# Patient Record
Sex: Male | Born: 1945 | Race: White | Hispanic: No | Marital: Married | Smoking: Never smoker
Health system: Southern US, Community
[De-identification: ages and names within clinical notes are randomized; demographics above are authoritative.]

## PROBLEM LIST (undated history)

## (undated) DIAGNOSIS — E785 Hyperlipidemia, unspecified: Secondary | ICD-10-CM

## (undated) DIAGNOSIS — L989 Disorder of the skin and subcutaneous tissue, unspecified: Secondary | ICD-10-CM

## (undated) DIAGNOSIS — I10 Essential (primary) hypertension: Secondary | ICD-10-CM

## (undated) HISTORY — DX: Essential (primary) hypertension: I10

## (undated) HISTORY — DX: Disorder of the skin and subcutaneous tissue, unspecified: L98.9

## (undated) HISTORY — PX: HERNIA REPAIR: SHX51

---

## 2011-03-14 HISTORY — PX: PROSTATE SURGERY: SHX751

## 2011-03-16 ENCOUNTER — Emergency Department (HOSPITAL_BASED_OUTPATIENT_CLINIC_OR_DEPARTMENT_OTHER)
Admission: EM | Admit: 2011-03-16 | Discharge: 2011-03-16 | Disposition: A | Discharge status: Home / Self Care | Payer: 59 | Attending: Emergency Medicine | Admitting: Emergency Medicine

## 2011-03-16 DIAGNOSIS — R339 Retention of urine, unspecified: Secondary | ICD-10-CM | POA: Insufficient documentation

## 2011-03-16 LAB — URINALYSIS, ROUTINE W REFLEX MICROSCOPIC
Bilirubin Urine: NEGATIVE
Nitrite: NEGATIVE
Protein, ur: NEGATIVE mg/dL
Specific Gravity, Urine: 1.018 (ref 1.005–1.030)
Urobilinogen, UA: 0.2 mg/dL (ref 0.0–1.0)

## 2011-03-16 LAB — URINE MICROSCOPIC-ADD ON

## 2011-04-10 ENCOUNTER — Ambulatory Visit (HOSPITAL_BASED_OUTPATIENT_CLINIC_OR_DEPARTMENT_OTHER)
Admission: RE | Admit: 2011-04-10 | Discharge: 2011-04-11 | Disposition: A | Discharge status: Home / Self Care | Payer: 59 | Source: Ambulatory Visit | Attending: Urology | Admitting: Urology

## 2011-04-10 ENCOUNTER — Other Ambulatory Visit: Payer: Self-pay | Admitting: Urology

## 2011-04-10 DIAGNOSIS — N138 Other obstructive and reflux uropathy: Secondary | ICD-10-CM | POA: Insufficient documentation

## 2011-04-10 DIAGNOSIS — N401 Enlarged prostate with lower urinary tract symptoms: Secondary | ICD-10-CM | POA: Insufficient documentation

## 2011-04-10 DIAGNOSIS — Z01810 Encounter for preprocedural cardiovascular examination: Secondary | ICD-10-CM | POA: Insufficient documentation

## 2011-04-10 DIAGNOSIS — R9431 Abnormal electrocardiogram [ECG] [EKG]: Secondary | ICD-10-CM | POA: Insufficient documentation

## 2011-04-10 DIAGNOSIS — R339 Retention of urine, unspecified: Secondary | ICD-10-CM | POA: Insufficient documentation

## 2011-04-10 DIAGNOSIS — Z79899 Other long term (current) drug therapy: Secondary | ICD-10-CM | POA: Insufficient documentation

## 2011-04-10 DIAGNOSIS — Z01812 Encounter for preprocedural laboratory examination: Secondary | ICD-10-CM | POA: Insufficient documentation

## 2011-04-10 DIAGNOSIS — I10 Essential (primary) hypertension: Secondary | ICD-10-CM | POA: Insufficient documentation

## 2011-04-10 DIAGNOSIS — K219 Gastro-esophageal reflux disease without esophagitis: Secondary | ICD-10-CM | POA: Insufficient documentation

## 2011-04-10 LAB — POCT I-STAT, CHEM 8
BUN: 12 mg/dL (ref 6–23)
Creatinine, Ser: 0.7 mg/dL (ref 0.50–1.35)
Glucose, Bld: 93 mg/dL (ref 70–99)
Potassium: 3.8 mEq/L (ref 3.5–5.1)
Sodium: 141 mEq/L (ref 135–145)

## 2011-04-13 NOTE — Op Note (Signed)
  NAMEEDVARDO, HONSE                ACCOUNT NO.:  [id number]  MEDICAL RECORD NO.:  LOCATION:                                 FACILITY:  PHYSICIAN:  Mirabelle Cyphers J. Annabell Howells, M.D.    DATE OF BIRTH:  [birth year]  DATE OF PROCEDURE:  04/10/2011 DATE OF DISCHARGE:                              OPERATIVE REPORT   PROCEDURE:  Transurethral resection of prostate.  PREOPERATIVE DIAGNOSIS:  Benign prostatic hyperplasia with retention.  POSTOPERATIVE DIAGNOSIS:  Benign prostatic hyperplasia with retention.  SURGEON:  Excell Seltzer. Annabell Howells, MD  ANESTHESIA:  General.  SPECIMEN:  Prostate chips.  DRAINS:  A 22-French three-way Foley catheter.  BLOOD LOSS:  500 cc.  COMPLICATIONS:  None.  INDICATIONS:  Mr. Tirado is a 65 year old [place] male with BPH with bladder outlet obstruction and acute urinary retention who is to undergo TURP.  FINDINGS OF PROCEDURE:  He was taken to operating room.  He was given Cipro, a general anesthetic was induced, fitted PAS hose.  He was placed in lithotomy position.  His perineum and genitalia were prepped with Betadine solution and he was draped in usual sterile fashion.  The 47- French continuous flow resectoscope sheath was inserted.  This was fitted to [place] handle with 12-degree lens and 24-French gyrus loop. Saline was used as the irrigant.  Resection was initiated at the bladder neck, middle lobe was resected down to the bladder neck fibers.  The floor of the prostate was then resected out to alongside the verumontanum.  The right lobe of the prostate was resected from bladder neck to apex to capsular fibers. This was followed by the left lobe.  Hemostasis was used as we progressed.  The chips were removed initially and then additional resection at the apex and the bladder neck and anterior prostate were performed followed by some residual resection of floor once the bulk of the prostate which was quite large was removed.  Once all tissue then resected,  hemostasis was achieved and the bladder was evacuated free of chips.  Inspection of bladder revealed intact ureteral orifices, bladder moderate trabeculation, no tumors or stones were noted.  At this point, a 22-French Foley catheter was inserted, balloon was filled with 30 cc sterile fluid.  The catheter was irrigated with saline with clear return and placed to continuous irrigation and straight drainage.  He was taken down from lithotomy position.  His anesthetic reversed and he was moved to the recovery room in stable condition.  There were no complications.     Excell Seltzer. Annabell Howells, M.D.     JJW/MEDQ  D:  04/10/2011  T:  04/11/2011  Job:  [id number]  Electronically Signed by Bjorn Pippin M.D. on 04/13/2011 12:08:48 PM

## 2011-12-08 ENCOUNTER — Ambulatory Visit (INDEPENDENT_AMBULATORY_CARE_PROVIDER_SITE_OTHER): Payer: 59 | Admitting: Surgery

## 2011-12-08 ENCOUNTER — Telehealth (INDEPENDENT_AMBULATORY_CARE_PROVIDER_SITE_OTHER): Payer: Self-pay

## 2011-12-08 NOTE — Telephone Encounter (Signed)
Left message with pt's wife to call us so I can give his new r/s appt from 2-25 to 3-11 with DR Biagio Quint arrive at 3:30pm.

## 2011-12-22 ENCOUNTER — Ambulatory Visit (INDEPENDENT_AMBULATORY_CARE_PROVIDER_SITE_OTHER): Payer: 59 | Admitting: General Surgery

## 2012-01-02 ENCOUNTER — Encounter (INDEPENDENT_AMBULATORY_CARE_PROVIDER_SITE_OTHER): Payer: Self-pay | Admitting: General Surgery

## 2012-01-02 ENCOUNTER — Ambulatory Visit (INDEPENDENT_AMBULATORY_CARE_PROVIDER_SITE_OTHER): Payer: 59 | Admitting: General Surgery

## 2012-01-02 VITALS — BP 162/112 | HR 62 | Temp 98.0°F | Resp 20 | Ht 70.0 in | Wt 192.0 lb

## 2012-01-02 DIAGNOSIS — R229 Localized swelling, mass and lump, unspecified: Secondary | ICD-10-CM

## 2012-01-02 DIAGNOSIS — R220 Localized swelling, mass and lump, head: Secondary | ICD-10-CM

## 2012-01-02 NOTE — Progress Notes (Signed)
Patient ID: Johnny Heath, male   DOB: Mar 09, 1946, 66 y.o.   MRN: [id number]  No chief complaint on file.   HPI Johnny Heath is a 66 y.o. male. This patient is referred for evaluation of left posterior scalp mass which he states has been present for many years. He says this started out small and just about pea-sized but over time has increased to its current size. He denies any discomfort from the lesion and denies any redness, drainage, or fevers. Denies any exacerbating or relieving symptoms. HPI  No past medical history on file. PMH: HTN  No past surgical history on file. PSH: prostate surgery  No family history on file. Noncontributory  Social History History  Substance Use Topics  . Smoking status: Not on file  . Smokeless tobacco: Not on file  . Alcohol Use: Not on file  He denies any smoking or drinking and is originally from [place] but has been here for 30 yrs  Allergies not on file PCN heart  No current outpatient prescriptions on file.  Meds: Amlodipine and  Review of Systems Review of Systems All other review of systems negative or noncontributory except as stated in the HPI  There were no vitals taken for this visit.  Physical Exam Physical Exam Physical Exam  Vitals reviewed. Constitutional: He is oriented to person, place, and time. He appears well-developed and well-nourished. No distress.  HENT:  Head: Normocephalic and atraumatic.  Mouth/Throat: No oropharyngeal exudate.  Eyes: Conjunctivae and EOM are normal. Pupils are equal, round, and reactive to light. Right eye exhibits no discharge. Left eye exhibits no discharge. No scleral icterus.  Neck: Normal range of motion. No tracheal deviation present.  Cardiovascular: Normal rate, regular rhythm and normal heart sounds.   Pulmonary/Chest: Effort normal and breath sounds normal. No stridor. No respiratory distress. He has no wheezes. He has no rales. He exhibits no tenderness.  Abdominal: Soft. Bowel  sounds are normal. He exhibits no distension and no mass. There is no tenderness. There is no rebound and no guarding.  Musculoskeletal: Normal range of motion. He exhibits no edema and no tenderness.  Neurological: He is alert and oriented to person, place, and time.  Skin: Skin is warm and dry. No rash noted. He is not diaphoretic. No erythema. No pallor. Left posterior scalp mass which feels mobile and is about 5cm in diameter.  It is soft and no sign of infection. Psychiatric: He has a normal mood and affect. His behavior is normal. Judgment and thought content normal.    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 This has been present for 30-40 years and has not caused any problems for him to this point. I discussed with him the options for excision versus continued observation and the risks and benefits of each. I recommended CT scan or evaluation since this is on the scalp it is fairly decent size. If you would like excision and would be happy to office to him and I did discuss with him the risks of the procedure including infection, bleeding, pain, scarring, difficulty with wound closure, and nerve injury and recurrence and expressed understanding. We will proceed with CT scan and he will follow up after this we discussed surgical excision.    Plan    We will check a CT scan of the head and he will follow up after this to discuss surgical excision if he desires  Lodema Pilot DAVID 01/02/2012, 8:28 AM

## 2012-01-09 ENCOUNTER — Other Ambulatory Visit: Payer: 59

## 2012-01-29 ENCOUNTER — Telehealth (INDEPENDENT_AMBULATORY_CARE_PROVIDER_SITE_OTHER): Payer: Self-pay

## 2012-01-29 ENCOUNTER — Other Ambulatory Visit (INDEPENDENT_AMBULATORY_CARE_PROVIDER_SITE_OTHER): Payer: Self-pay

## 2012-01-29 DIAGNOSIS — R220 Localized swelling, mass and lump, head: Secondary | ICD-10-CM

## 2012-01-29 NOTE — Telephone Encounter (Signed)
Peer-to-peer with [facility] insurance for approval of CT head, approval rec'd auth# [id number] --  auth # [id number].  Orders given to D.M. For scheduling

## 2012-01-30 ENCOUNTER — Telehealth (INDEPENDENT_AMBULATORY_CARE_PROVIDER_SITE_OTHER): Payer: Self-pay | Admitting: General Surgery

## 2012-01-30 ENCOUNTER — Encounter (INDEPENDENT_AMBULATORY_CARE_PROVIDER_SITE_OTHER): Payer: 59 | Admitting: General Surgery

## 2012-02-02 ENCOUNTER — Other Ambulatory Visit: Payer: 59

## 2012-02-02 NOTE — Telephone Encounter (Signed)
Patient cxl'd CT of head per [facility].

## 2012-02-06 ENCOUNTER — Encounter (INDEPENDENT_AMBULATORY_CARE_PROVIDER_SITE_OTHER): Payer: 59 | Admitting: General Surgery

## 2014-11-13 ENCOUNTER — Emergency Department (HOSPITAL_BASED_OUTPATIENT_CLINIC_OR_DEPARTMENT_OTHER)
Admission: EM | Admit: 2014-11-13 | Discharge: 2014-11-13 | Disposition: A | Discharge status: Home / Self Care | Payer: Medicare Other | Attending: Emergency Medicine | Admitting: Emergency Medicine

## 2014-11-13 ENCOUNTER — Encounter (HOSPITAL_BASED_OUTPATIENT_CLINIC_OR_DEPARTMENT_OTHER): Payer: Self-pay | Admitting: *Deleted

## 2014-11-13 ENCOUNTER — Emergency Department (HOSPITAL_BASED_OUTPATIENT_CLINIC_OR_DEPARTMENT_OTHER): Payer: Medicare Other

## 2014-11-13 DIAGNOSIS — I10 Essential (primary) hypertension: Secondary | ICD-10-CM | POA: Diagnosis not present

## 2014-11-13 DIAGNOSIS — R42 Dizziness and giddiness: Secondary | ICD-10-CM | POA: Diagnosis present

## 2014-11-13 DIAGNOSIS — Z880 Allergy status to penicillin: Secondary | ICD-10-CM | POA: Insufficient documentation

## 2014-11-13 DIAGNOSIS — Z872 Personal history of diseases of the skin and subcutaneous tissue: Secondary | ICD-10-CM | POA: Diagnosis not present

## 2014-11-13 LAB — URINE MICROSCOPIC-ADD ON

## 2014-11-13 LAB — COMPREHENSIVE METABOLIC PANEL
ALK PHOS: 76 U/L (ref 39–117)
ALT: 26 U/L (ref 0–53)
AST: 23 U/L (ref 0–37)
Albumin: 4.1 g/dL (ref 3.5–5.2)
Anion gap: 5 (ref 5–15)
BILIRUBIN TOTAL: 1.3 mg/dL — AB (ref 0.3–1.2)
BUN: 18 mg/dL (ref 6–23)
CHLORIDE: 107 mmol/L (ref 96–112)
CO2: 24 mmol/L (ref 19–32)
Calcium: 9 mg/dL (ref 8.4–10.5)
Creatinine, Ser: 1.03 mg/dL (ref 0.50–1.35)
GFR, EST AFRICAN AMERICAN: 84 mL/min — AB (ref >90)
GFR, EST NON AFRICAN AMERICAN: 72 mL/min — AB (ref >90)
GLUCOSE: 141 mg/dL — AB (ref 70–99)
Potassium: 3.4 mmol/L — ABNORMAL LOW (ref 3.5–5.1)
SODIUM: 136 mmol/L (ref 135–145)
Total Protein: 7.3 g/dL (ref 6.0–8.3)

## 2014-11-13 LAB — URINALYSIS, ROUTINE W REFLEX MICROSCOPIC
Bilirubin Urine: NEGATIVE
GLUCOSE, UA: NEGATIVE mg/dL
Ketones, ur: NEGATIVE mg/dL
LEUKOCYTES UA: NEGATIVE
Nitrite: NEGATIVE
PROTEIN: NEGATIVE mg/dL
SPECIFIC GRAVITY, URINE: 1.004 — AB (ref 1.005–1.030)
Urobilinogen, UA: 0.2 mg/dL (ref 0.0–1.0)
pH: 6.5 (ref 5.0–8.0)

## 2014-11-13 LAB — CBC
HEMATOCRIT: 45.2 % (ref 39.0–52.0)
Hemoglobin: 15.1 g/dL (ref 13.0–17.0)
MCH: 26.3 pg (ref 26.0–34.0)
MCHC: 33.4 g/dL (ref 30.0–36.0)
MCV: 78.7 fL (ref 78.0–100.0)
PLATELETS: 266 10*3/uL (ref 150–400)
RBC: 5.74 MIL/uL (ref 4.22–5.81)
RDW: 14.4 % (ref 11.5–15.5)
WBC: 7.2 10*3/uL (ref 4.0–10.5)

## 2014-11-13 LAB — TROPONIN I: Troponin I: <0.03 ng/mL (ref <0.031)

## 2014-11-13 MED ORDER — SODIUM CHLORIDE 0.9 % IV BOLUS (SEPSIS)
1000.0000 mL | INTRAVENOUS | Status: AC
  Administered 2014-11-13: 1000 mL via INTRAVENOUS

## 2014-11-13 NOTE — ED Notes (Signed)
Pt placed on cardiac monitor 

## 2014-11-13 NOTE — ED Notes (Signed)
C/o dizziness since Saturday. States it has been a long time ago he had same issue. No other sx.

## 2014-11-13 NOTE — Discharge Instructions (Signed)

## 2014-11-13 NOTE — ED Provider Notes (Signed)
CSN: [id number]     Arrival date & time 11/13/14  [id number] History   First MD Initiated Contact with Patient 11/13/14 0901     Chief Complaint  Patient presents with  . Dizziness     (Consider location/radiation/quality/duration/timing/severity/associated sxs/prior Treatment) Patient is a 69 y.o. male presenting with dizziness. The history is provided by the patient. The history is limited by a language barrier. No language interpreter was used (family member).  Dizziness Quality:  Room spinning Severity:  Mild Onset quality:  Sudden Duration:  2 days Timing:  Intermittent Progression:  Unchanged Chronicity:  New Context comment:  Worse when lying supine Relieved by:  Being still (sitting up) Exacerbated by: lying down. Ineffective treatments:  None tried Associated symptoms: no chest pain, no diarrhea, no headaches, no nausea, no shortness of breath and no vomiting     Past Medical History  Diagnosis Date  . Hypertension   . Skin lesion of scalp    Past Surgical History  Procedure Laterality Date  . Prostate surgery  03/2011   No family history on file. History  Substance Use Topics  . Smoking status: Never Smoker   . Smokeless tobacco: Never Used  . Alcohol Use: No    Review of Systems  Constitutional: Negative for fever.  HENT: Negative for drooling and rhinorrhea.   Eyes: Negative for pain.  Respiratory: Negative for cough and shortness of breath.   Cardiovascular: Negative for chest pain and leg swelling.  Gastrointestinal: Negative for nausea, vomiting, abdominal pain and diarrhea.  Genitourinary: Negative for dysuria and hematuria.  Musculoskeletal: Negative for gait problem and neck pain.  Skin: Negative for color change.  Neurological: Positive for dizziness. Negative for numbness and headaches.  Hematological: Negative for adenopathy.  Psychiatric/Behavioral: Negative for behavioral problems.  All other systems reviewed and are negative.     Allergies   Penicillins  Home Medications   Prior to Admission medications   Medication Sig Start Date End Date Taking? Authorizing Provider  amLODipine (NORVASC) 5 MG tablet Take 5 mg by mouth as needed.    Historical Provider, MD   BP 152/95 mmHg  Pulse 104  Temp(Src) 98.9 F (37.2 C) (Oral)  Resp 18  Ht 5\' 10"  (1.778 m)  Wt 186 lb (84.369 kg)  BMI 26.69 kg/m2  SpO2 96% Physical Exam  Constitutional: He is oriented to person, place, and time. He appears well-developed and well-nourished.  HENT:  Head: Normocephalic and atraumatic.  Right Ear: External ear normal.  Left Ear: External ear normal.  Nose: Nose normal.  Mouth/Throat: Oropharynx is clear and moist. No oropharyngeal exudate.  Eyes: Conjunctivae and EOM are normal. Pupils are equal, round, and reactive to light.  Neck: Normal range of motion. Neck supple.  Cardiovascular: Normal rate, regular rhythm, normal heart sounds and intact distal pulses.  Exam reveals no gallop and no friction rub.   No murmur heard. Pulmonary/Chest: Effort normal and breath sounds normal. No respiratory distress. He has no wheezes.  Abdominal: Soft. Bowel sounds are normal. He exhibits no distension. There is no tenderness. There is no rebound and no guarding.  Musculoskeletal: Normal range of motion. He exhibits no edema or tenderness.  Neurological: He is alert and oriented to person, place, and time.  alert, oriented x3 speech: normal in context and clarity memory: intact grossly cranial nerves II-XII: intact motor strength: full proximally and distally no involuntary movements or tremors sensation: intact to light touch diffusely  cerebellar: finger-to-nose and heel-to-shin intact gait: normal forwards  and backwards, normal tandem gait   Skin: Skin is warm and dry.  Psychiatric: He has a normal mood and affect. His behavior is normal.  Nursing note and vitals reviewed.   ED Course  Procedures (including critical care time) Labs  Review Labs Reviewed  COMPREHENSIVE METABOLIC PANEL - Abnormal; Notable for the following:    Potassium 3.4 (*)    Glucose, Bld 141 (*)    Total Bilirubin 1.3 (*)    GFR calc non Af Amer 72 (*)    GFR calc Af Amer 84 (*)    All other components within normal limits  URINALYSIS, ROUTINE W REFLEX MICROSCOPIC - Abnormal; Notable for the following:    Specific Gravity, Urine 1.004 (*)    Hgb urine dipstick TRACE (*)    All other components within normal limits  TROPONIN I  CBC  URINE MICROSCOPIC-ADD ON    Imaging Review Ct Head Wo Contrast  11/13/2014   CLINICAL DATA:  Dizziness beginning 11/11/2014.  EXAM: CT HEAD WITHOUT CONTRAST  TECHNIQUE: Contiguous axial images were obtained from the base of the skull through the vertex without intravenous contrast.  COMPARISON:  None.  FINDINGS: Mild atrophy is noted. No acute intracranial abnormality including hemorrhage, infarct, mass lesion, mass effect, midline shift or abnormal extra-axial fluid collection is identified. No hydrocephalus or pneumocephalus. The calvarium is intact. Imaged paranasal sinuses and mastoid air cells are clear.  IMPRESSION: No acute abnormality.  Mild atrophy.   Electronically Signed   By: Drusilla Kanner M.D.   On: 11/13/2014 09:41     EKG Interpretation   Date/Time:  Monday November 13 2014 09:02:33 EST Ventricular Rate:  86 PR Interval:  172 QRS Duration: 102 QT Interval:  348 QTC Calculation: 416 R Axis:   -3 Text Interpretation:  Normal sinus rhythm Septal infarct , age  undetermined Non-specific ST-t changes Otherwise no significant change  Confirmed by Lanaiya Lantry  MD, Caya Soberanis ([id number]) on 11/13/2014 9:05:50 AM      MDM   Final diagnoses:  Dizziness    9:17 AM 69 y.o. male who presents with concerns of high blood pressure and dizziness which began 2 days ago. The patient states that he had sudden onset dizziness while sitting on the couch. He notes it only lasted a few seconds. He has had several  recurrent episodes and his dizziness seems to be worse when lying supine. He is afebrile and vital signs are unremarkable on my exam. He has a normal neurologic exam. His son notes that his blood pressures have been running in the systolic 170s to 161W. Doubt CVA given the abrupt onset, intermittent nature, and worsening of dizziness w/ position. We'll get screening labs and imaging. Will hydrate.  10:29 AM: The patient continues to appear well. I recommended the patient make a diary of his blood pressure so that he can follow-up with his primary care doctor within the next 1-2 weeks. I doubt that his symptoms are related to CVA and this was discussed with the patient and his family. I recommended they return for any worsening of symptoms. I have discussed the diagnosis/risks/treatment options with the patient and family and believe the pt to be eligible for discharge home to follow-up with his pcp in 1-2 weeks. We also discussed returning to the ED immediately if new or worsening sx occur. We discussed the sx which are most concerning (e.g., worsening dizziness, ataxia, fever) that necessitate immediate return. Medications administered to the patient during their visit and any new prescriptions  provided to the patient are listed below.  Medications given during this visit Medications  sodium chloride 0.9 % bolus 1,000 mL (1,000 mLs Intravenous New Bag/Given 11/13/14 0930)    New Prescriptions   No medications on file     Purvis Sheffield, MD 11/13/14 1030

## 2015-10-14 IMAGING — CT CT HEAD W/O CM
1 series · 16 of 30 positions shown, 20 images · non-contrast
Comparison: None.

CLINICAL DATA: Dizziness beginning 11/11/2014.

EXAM:
CT HEAD WITHOUT CONTRAST
TECHNIQUE: Contiguous axial images were obtained from the base of the skull
through the vertex without intravenous contrast.

[Series 2: head 4.8 h37s · axial · 0.45mm/px · z∈[-63,+70]mm · 16 of 32 slices shown, 20 images]
[im 2/32  brain]
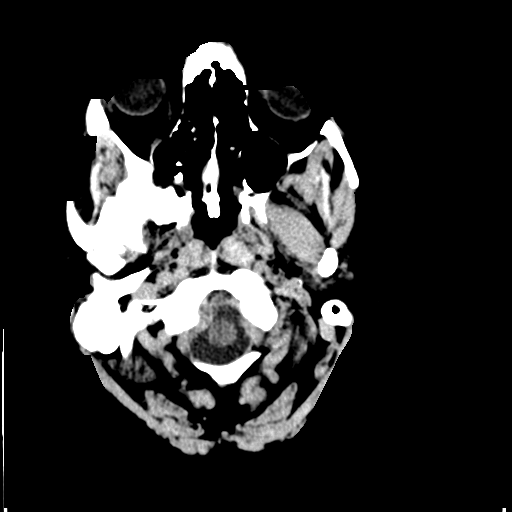
[im 2/32  bone]
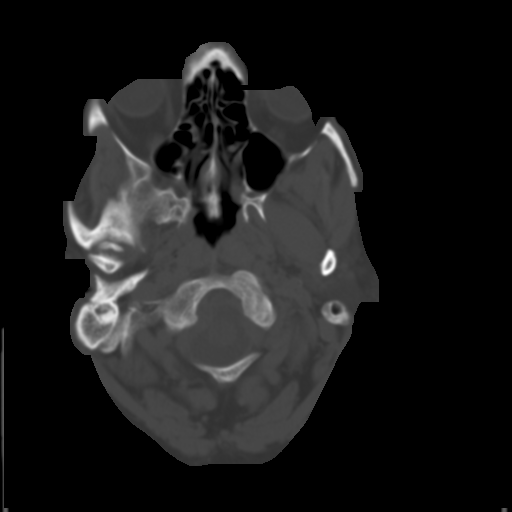
[im 4/32  brain]
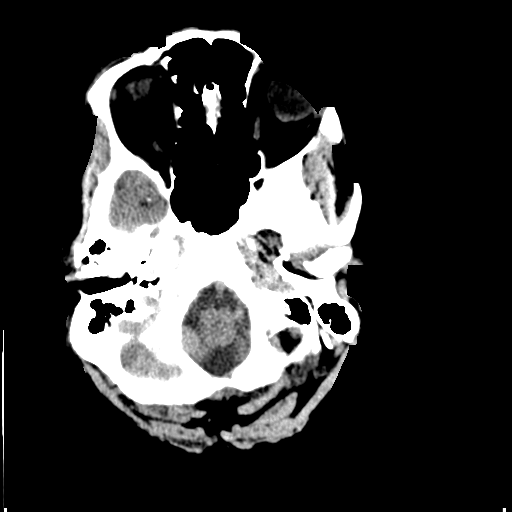
[im 6/32  brain]
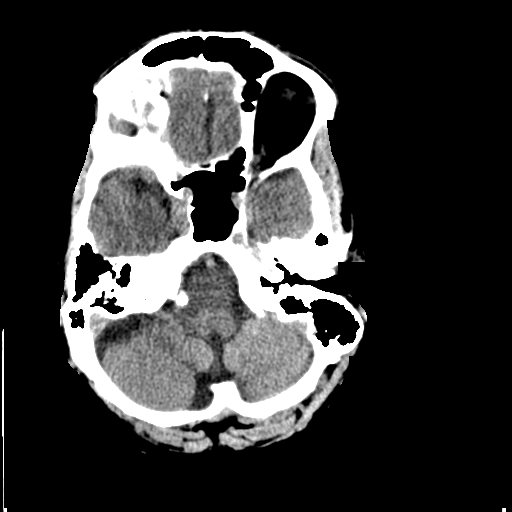
[im 8/32  brain]
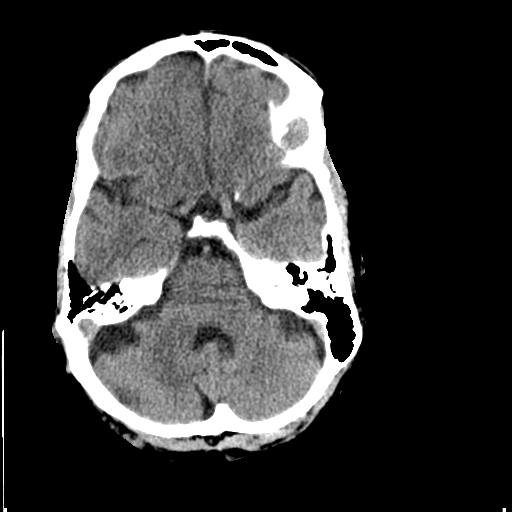
[im 9/32  brain]
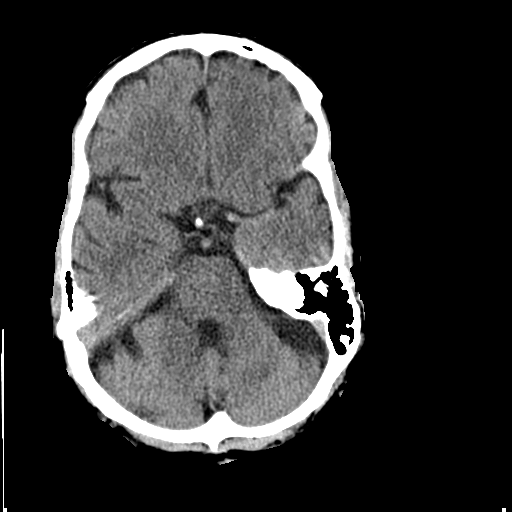
[im 9/32  bone]
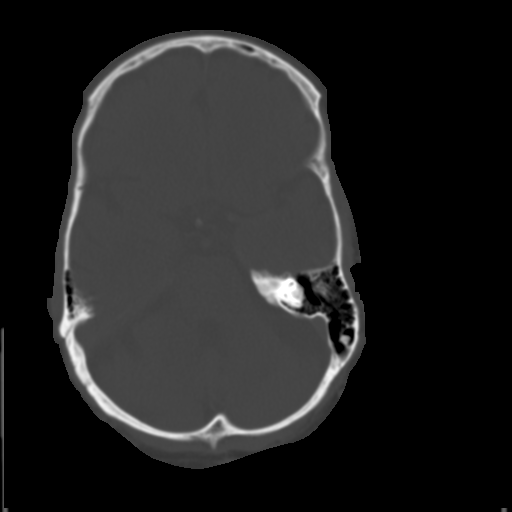
[im 11/32  brain]
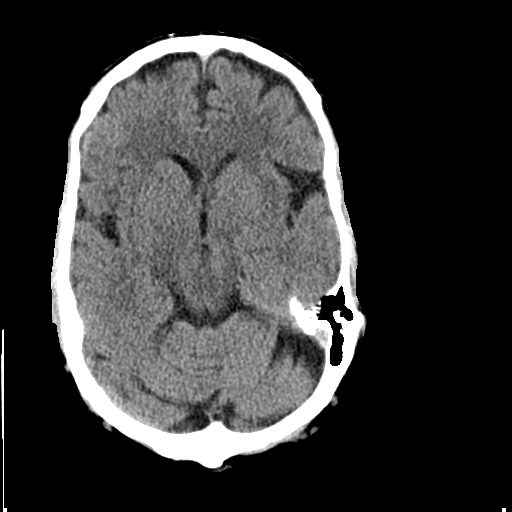
[im 13/32  brain]
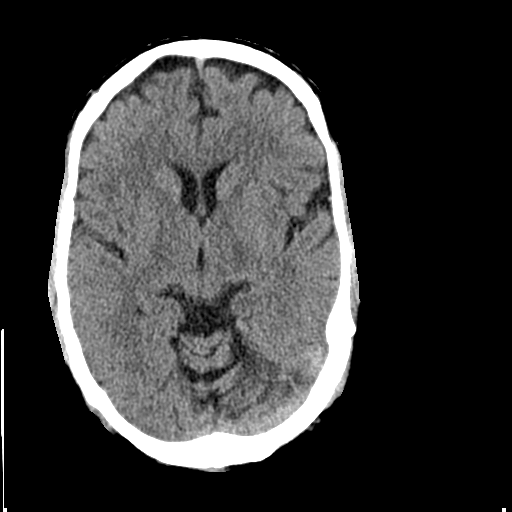
[im 15/32  brain]
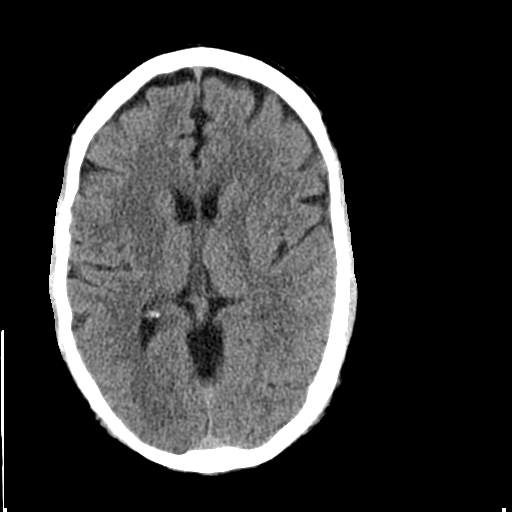
[im 17/32  brain]
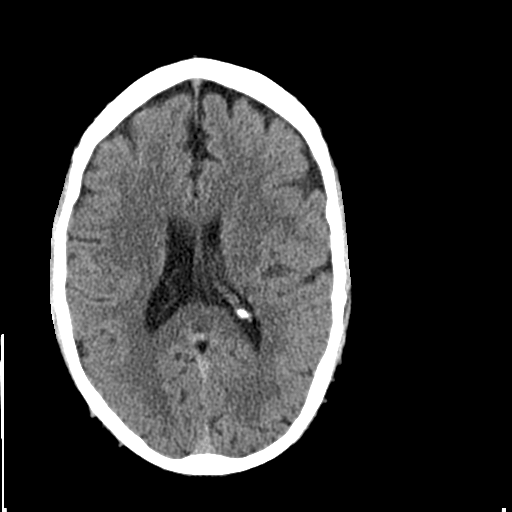
[im 17/32  bone]
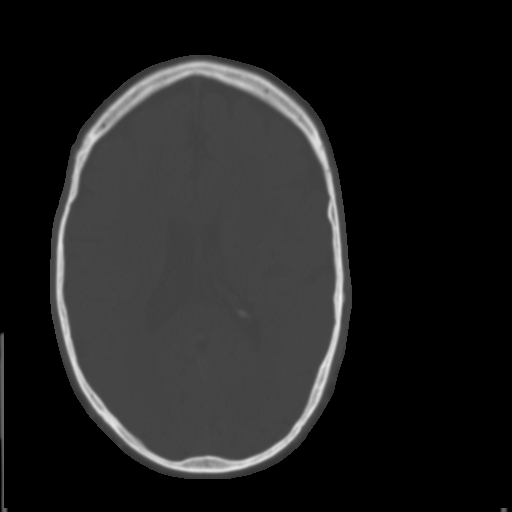
[im 19/32  brain]
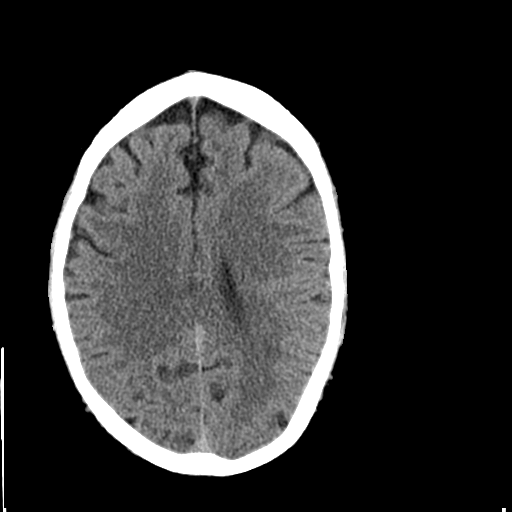
[im 21/32  brain]
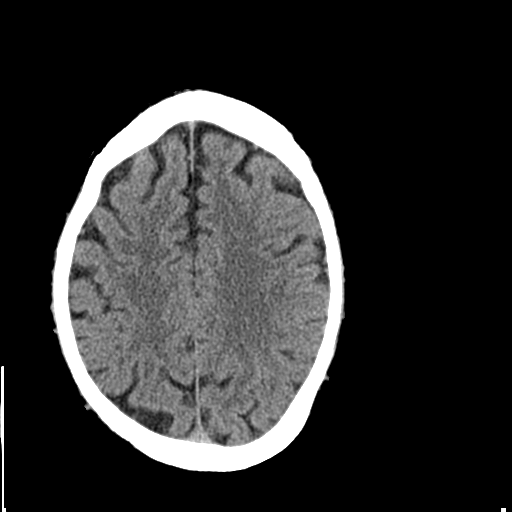
[im 23/32  brain]
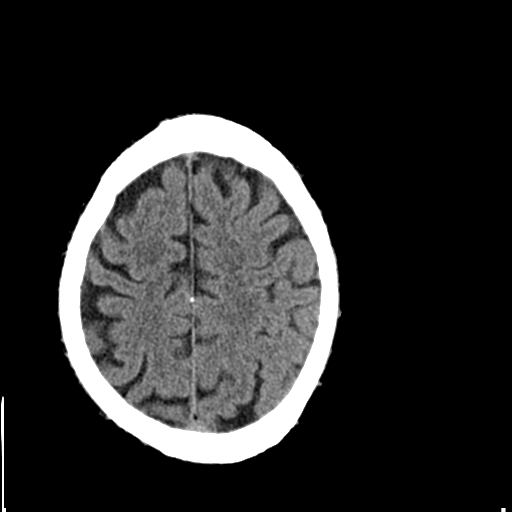
[im 24/32  brain]
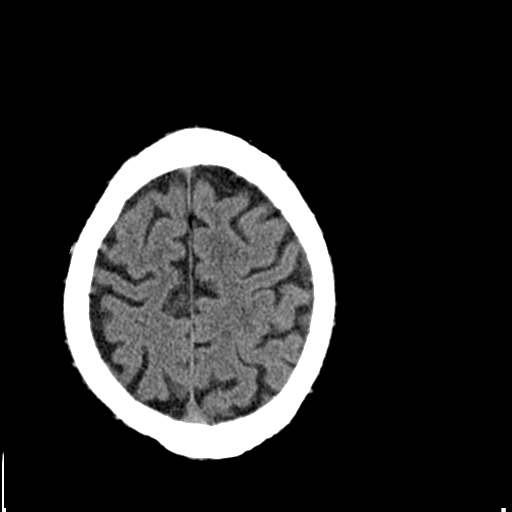
[im 24/32  bone]
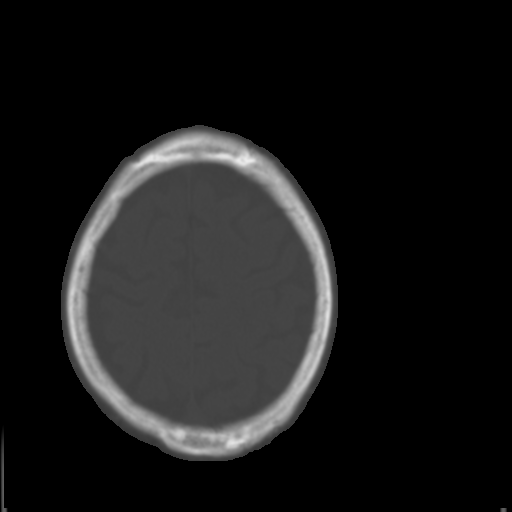
[im 26/32  brain]
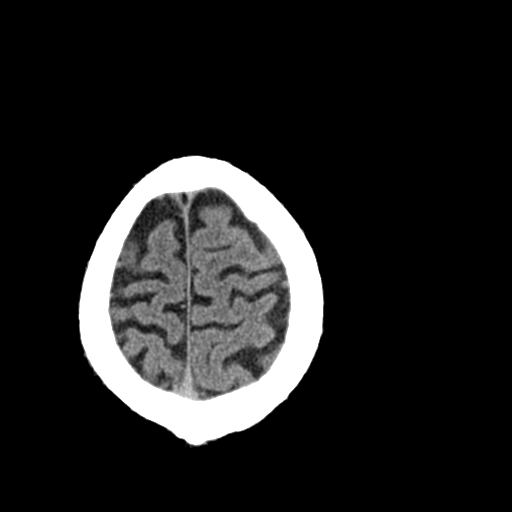
[im 28/32  brain]
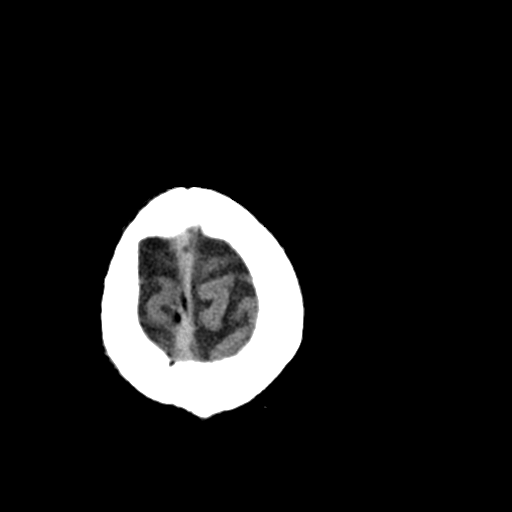
[im 30/32  brain]
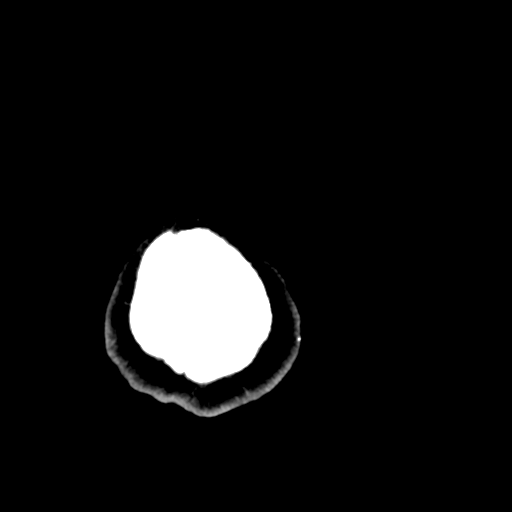

[16 of 30 positions shown; findings below may reference images not displayed]

FINDINGS: Mild atrophy is noted. No acute intracranial abnormality including
hemorrhage, infarct, mass lesion, mass effect, midline shift or
abnormal extra-axial fluid collection is identified. No
hydrocephalus or pneumocephalus. The calvarium is intact. Imaged
paranasal sinuses and mastoid air cells are clear.
IMPRESSION: No acute abnormality.  Mild atrophy.

## 2021-11-28 DIAGNOSIS — K4090 Unilateral inguinal hernia, without obstruction or gangrene, not specified as recurrent: Secondary | ICD-10-CM | POA: Insufficient documentation

## 2022-05-31 ENCOUNTER — Encounter (HOSPITAL_BASED_OUTPATIENT_CLINIC_OR_DEPARTMENT_OTHER): Payer: Self-pay | Admitting: Emergency Medicine

## 2022-05-31 ENCOUNTER — Other Ambulatory Visit: Payer: Self-pay

## 2022-05-31 ENCOUNTER — Emergency Department (HOSPITAL_BASED_OUTPATIENT_CLINIC_OR_DEPARTMENT_OTHER): Payer: Medicare Other

## 2022-05-31 ENCOUNTER — Emergency Department (HOSPITAL_BASED_OUTPATIENT_CLINIC_OR_DEPARTMENT_OTHER)
Admission: EM | Admit: 2022-05-31 | Discharge: 2022-05-31 | Disposition: A | Discharge status: Home / Self Care | Payer: Medicare Other | Attending: Emergency Medicine | Admitting: Emergency Medicine

## 2022-05-31 DIAGNOSIS — R1084 Generalized abdominal pain: Secondary | ICD-10-CM | POA: Insufficient documentation

## 2022-05-31 DIAGNOSIS — I10 Essential (primary) hypertension: Secondary | ICD-10-CM | POA: Diagnosis not present

## 2022-05-31 LAB — URINALYSIS, ROUTINE W REFLEX MICROSCOPIC
Bilirubin Urine: NEGATIVE
Glucose, UA: NEGATIVE mg/dL
Ketones, ur: NEGATIVE mg/dL
Leukocytes,Ua: NEGATIVE
Nitrite: NEGATIVE
Protein, ur: NEGATIVE mg/dL
Specific Gravity, Urine: <=1.005 (ref 1.005–1.030)
pH: 6 (ref 5.0–8.0)

## 2022-05-31 LAB — COMPREHENSIVE METABOLIC PANEL
ALT: 17 U/L (ref 0–44)
AST: 21 U/L (ref 15–41)
Albumin: 3.8 g/dL (ref 3.5–5.0)
Alkaline Phosphatase: 65 U/L (ref 38–126)
Anion gap: 7 (ref 5–15)
BUN: 16 mg/dL (ref 8–23)
CO2: 26 mmol/L (ref 22–32)
Calcium: 8.9 mg/dL (ref 8.9–10.3)
Chloride: 105 mmol/L (ref 98–111)
Creatinine, Ser: 0.92 mg/dL (ref 0.61–1.24)
GFR, Estimated: >60 mL/min (ref >60)
Glucose, Bld: 119 mg/dL — ABNORMAL HIGH (ref 70–99)
Potassium: 3.4 mmol/L — ABNORMAL LOW (ref 3.5–5.1)
Sodium: 138 mmol/L (ref 135–145)
Total Bilirubin: 1.9 mg/dL — ABNORMAL HIGH (ref 0.3–1.2)
Total Protein: 7.5 g/dL (ref 6.5–8.1)

## 2022-05-31 LAB — CBC
HCT: 40.5 % (ref 39.0–52.0)
Hemoglobin: 13.2 g/dL (ref 13.0–17.0)
MCH: 25.4 pg — ABNORMAL LOW (ref 26.0–34.0)
MCHC: 32.6 g/dL (ref 30.0–36.0)
MCV: 77.9 fL — ABNORMAL LOW (ref 80.0–100.0)
Platelets: 268 10*3/uL (ref 150–400)
RBC: 5.2 MIL/uL (ref 4.22–5.81)
RDW: 15.1 % (ref 11.5–15.5)
WBC: 7.7 10*3/uL (ref 4.0–10.5)
nRBC: 0 % (ref 0.0–0.2)

## 2022-05-31 LAB — URINALYSIS, MICROSCOPIC (REFLEX)

## 2022-05-31 LAB — LIPASE, BLOOD: Lipase: 37 U/L (ref 11–51)

## 2022-05-31 MED ORDER — IOHEXOL 300 MG/ML  SOLN
100.0000 mL | Freq: Once | INTRAMUSCULAR | Status: AC | PRN
  Administered 2022-05-31: 100 mL via INTRAVENOUS

## 2022-05-31 NOTE — ED Provider Notes (Signed)
[facility] EMERGENCY DEPARTMENT Provider Note   CSN: [id number] Arrival date & time: 05/31/22  1831     History  Chief Complaint  Patient presents with   Abdominal Pain    Johnny Heath is a 76 y.o. male.  Is here with abdominal pain for the last 4 days.  Denies any nausea, vomiting, diarrhea.  No fever.  History of hypertension.  Pain all over.  Nothing makes it worse or better.  Denies any fevers or chills.  No chest pain or shortness of breath.  The history is provided by the patient.       Home Medications Prior to Admission medications   Medication Sig Start Date End Date Taking? Authorizing Provider  amLODipine (NORVASC) 5 MG tablet Take 5 mg by mouth as needed.    [provider]      Allergies    Penicillins    Review of Systems   Review of Systems  Physical Exam Updated Vital Signs BP 136/86   Pulse 81   Temp 98.4 F (36.9 C) (Oral)   Resp (!) 25   Ht 5\' 10"  (1.778 m)   Wt 77.1 kg   SpO2 95%   BMI 24.39 kg/m  Physical Exam Vitals and nursing note reviewed.  Constitutional:      General: He is not in acute distress.    Appearance: He is well-developed. He is not ill-appearing.  HENT:     Head: Normocephalic and atraumatic.     Mouth/Throat:     Mouth: Mucous membranes are moist.  Eyes:     Conjunctiva/sclera: Conjunctivae normal.     Pupils: Pupils are equal, round, and reactive to light.  Cardiovascular:     Rate and Rhythm: Normal rate and regular rhythm.     Heart sounds: Normal heart sounds. No murmur heard. Pulmonary:     Effort: Pulmonary effort is normal. No respiratory distress.     Breath sounds: Normal breath sounds.  Abdominal:     Palpations: Abdomen is soft.     Tenderness: There is generalized abdominal tenderness.  Musculoskeletal:        General: No swelling.     Cervical back: Neck supple.  Skin:    General: Skin is warm and dry.     Capillary Refill: Capillary refill takes less than 2 seconds.   Neurological:     Mental Status: He is alert.  Psychiatric:        Mood and Affect: Mood normal.     ED Results / Procedures / Treatments   Labs (all labs ordered are listed, but only abnormal results are displayed) Labs Reviewed  COMPREHENSIVE METABOLIC PANEL - Abnormal; Notable for the following components:      Result Value   Potassium 3.4 (*)    Glucose, Bld 119 (*)    Total Bilirubin 1.9 (*)    All other components within normal limits  CBC - Abnormal; Notable for the following components:   MCV 77.9 (*)    MCH 25.4 (*)    All other components within normal limits  URINALYSIS, ROUTINE W REFLEX MICROSCOPIC - Abnormal; Notable for the following components:   Hgb urine dipstick SMALL (*)    All other components within normal limits  URINALYSIS, MICROSCOPIC (REFLEX) - Abnormal; Notable for the following components:   Bacteria, UA RARE (*)    All other components within normal limits  LIPASE, BLOOD    EKG EKG Interpretation  Date/Time:  Saturday May 31 2022 19:18:39  EDT Ventricular Rate:  79 PR Interval:    QRS Duration: 99 QT Interval:  388 QTC Calculation: 445 R Axis:   6 Text Interpretation: Normal sinus rhythm Borderline T abnormalities, inferior leads Confirmed by Virgina Norfolk ([id number]) on 05/31/2022 7:20:51 PM  Radiology CT ABDOMEN PELVIS W CONTRAST  Result Date: 05/31/2022 CLINICAL DATA:  Abdominal pain, acute, nonlocalized. EXAM: CT ABDOMEN AND PELVIS WITH CONTRAST TECHNIQUE: Multidetector CT imaging of the abdomen and pelvis was performed using the standard protocol following bolus administration of intravenous contrast. RADIATION DOSE REDUCTION: This exam was performed according to the departmental dose-optimization program which includes automated exposure control, adjustment of the mA and/or kV according to patient size and/or use of iterative reconstruction technique. CONTRAST:  OMNIPAQUE IOHEXOL 300 MG/ML  SOLN COMPARISON:  None Available. FINDINGS:  Lower chest: The heart is enlarged and there is no pericardial effusion. Atelectasis is noted at the lung bases bilaterally. Hepatobiliary: No focal liver abnormality. Mild hepatic steatosis is noted. No biliary ductal dilatation. Stones are present within the gallbladder. Pancreas: Unremarkable. No pancreatic ductal dilatation or surrounding inflammatory changes. Spleen: Normal in size without focal abnormality. Adrenals/Urinary Tract: No adrenal nodule or mass. The kidneys enhance symmetrically. Cysts are noted in the kidneys bilaterally. There is a 9 mm exophytic hypodensity in the upper pole of the left kidney with indeterminate imaging characteristics. No renal calculus or hydronephrosis. The bladder is unremarkable. Stomach/Bowel: There is a small hiatal hernia. The stomach is otherwise within normal limits. Multiple loops of mildly distended small bowel with air-fluid levels are noted in the abdomen measuring up to 3.3 cm. No free air or pneumatosis is seen. There is a changing caliber in the small bowel in the right lower quadrant with bowel wall thickening and intraluminal narrowing, best seen on axial image 74. Associated findings are mesenteric hyperemia and a small amount of free fluid in the mesenteric folds in the regions. Scattered diverticula are present along the colon without evidence of diverticulitis. A normal appendix is seen in the right lower quadrant. Vascular/Lymphatic: Mild atherosclerotic calcification of the aorta. Prominent lymph nodes are noted in the mesentery in the right lower quadrant measuring up to 9 mm. Reproductive: Prostate gland is enlarged. Other: Small amount of free fluid in the pelvis and mesenteric folds. A fat containing right inguinal hernia is noted. Musculoskeletal: Degenerative changes are present in the thoracolumbar spine. No acute or suspicious osseous abnormality. IMPRESSION: 1. Mildly distended loops of small bowel in the abdomen with air-fluid levels, hyperemia  and a small amount of free fluid in the mesenteric folds. There is a change in caliber of small bowel in the right lower quadrant with a segment distal small bowel with bowel wall thickening and intraluminal narrowing. Differential diagnosis includes infectious or inflammatory enteritis versus partial or early small bowel obstruction. Surgical consultation is suggested. 2. Diverticulosis without diverticulitis. 3. Small hiatal hernia. 4. Cholelithiasis. 5. Bilateral renal cysts. There is a complex exophytic lesion in the upper pole of the left kidney. Ultrasound is recommended for further characterization on nonemergent follow-up. 6. Enlarged prostate gland. 7. Minimal aortic atherosclerosis. Electronically Signed   By: Thornell Sartorius M.D.   On: 05/31/2022 20:40    Procedures Procedures    Medications Ordered in ED Medications  iohexol (OMNIPAQUE) 300 MG/ML solution 100 mL (100 mLs Intravenous Contrast Given 05/31/22 2010)    ED Course/ Medical Decision Making/ A&P  Medical Decision Making Amount and/or Complexity of Data Reviewed Labs: ordered. Radiology: ordered.  Risk Prescription drug management.   Johnny Heath is here with abdominal pain.  Normal vitals.  No fever.  Differential diagnosis is bowel obstruction versus diverticulitis versus pancreatitis versus colitis versus constipation/bowel gas.  Will check CBC, CMP, lipase, CT scan abdomen pelvis.  Patient overall appears well.  Per my review and interpretation of labs is no significant anemia, electrolyte abnormality, kidney injury, leukocytosis.  Lipase is normal.  Urinalysis with no evidence of infection.  Overall blood work is unremarkable.  CT scan showed mildly distended loops of small bowel in the abdomen with air-fluid levels, hyperemia, and a small amount of free fluid in the mesenteric folds.  There is change in the caliber of the small bowel in the right lower quadrant with may be some bowel wall  thickening and intraluminal narrowing.  Possible enteritis versus partial early small bowel obstruction.  Talked with Dr. Carolynne Edouard with general surgery who recommended clinical judgment.  I talked with the patient and overall sounds like his symptoms have really improved today.  He has been able to tolerate food.  He is not having any nausea or vomiting.  He states that his bowel movements have been normal.  He is passing gas.  Overall clinically I think that he likely has some sort of enteritis.  He has never had abdominal surgery.  No obvious mass on his CT.  Overall shared decision was made with the patient.    Clinically he feels better.  I have instructed him if he has nausea, vomiting, worsening pain, not passing gas or having bowel movements that he likely needs to come back for reevaluation.  At this time suspect that this is an enteritis that will be self resolving.  Discharged in good condition.  This chart was dictated using voice recognition software.  Despite best efforts to proofread,  errors can occur which can change the documentation meaning.         Final Clinical Impression(s) / ED Diagnoses Final diagnoses:  Generalized abdominal pain    Rx / DC Orders ED Discharge Orders     None         Virgina Norfolk, DO 05/31/22 2137

## 2022-05-31 NOTE — Discharge Instructions (Signed)
If you develop worsening abdominal pain, nausea, vomiting, not passing gas or having normal bowel movements please return for evaluation as discussed.  Otherwise follow-up with gastroenterologist.  Recommend soft diet for the next 24 hours and advance from there.

## 2022-05-31 NOTE — ED Triage Notes (Signed)
Pt arrives pov, c/o abdominal x 4 days. Denies N/v/d, denies fever

## 2023-07-29 DIAGNOSIS — K56609 Unspecified intestinal obstruction, unspecified as to partial versus complete obstruction: Secondary | ICD-10-CM | POA: Diagnosis present

## 2024-02-29 ENCOUNTER — Emergency Department (HOSPITAL_BASED_OUTPATIENT_CLINIC_OR_DEPARTMENT_OTHER)

## 2024-02-29 ENCOUNTER — Inpatient Hospital Stay (HOSPITAL_BASED_OUTPATIENT_CLINIC_OR_DEPARTMENT_OTHER)
Admission: EM | Admit: 2024-02-29 | Discharge: 2024-03-02 | DRG: 390 | Disposition: A | Discharge status: Home / Self Care | Attending: Family Medicine | Admitting: Family Medicine

## 2024-02-29 ENCOUNTER — Encounter (HOSPITAL_BASED_OUTPATIENT_CLINIC_OR_DEPARTMENT_OTHER): Payer: Self-pay

## 2024-02-29 ENCOUNTER — Inpatient Hospital Stay (HOSPITAL_COMMUNITY)

## 2024-02-29 ENCOUNTER — Other Ambulatory Visit: Payer: Self-pay

## 2024-02-29 DIAGNOSIS — K56609 Unspecified intestinal obstruction, unspecified as to partial versus complete obstruction: Secondary | ICD-10-CM | POA: Diagnosis present

## 2024-02-29 DIAGNOSIS — N2889 Other specified disorders of kidney and ureter: Secondary | ICD-10-CM | POA: Diagnosis present

## 2024-02-29 DIAGNOSIS — N400 Enlarged prostate without lower urinary tract symptoms: Secondary | ICD-10-CM | POA: Diagnosis present

## 2024-02-29 DIAGNOSIS — Z79899 Other long term (current) drug therapy: Secondary | ICD-10-CM

## 2024-02-29 DIAGNOSIS — R972 Elevated prostate specific antigen [PSA]: Secondary | ICD-10-CM | POA: Diagnosis present

## 2024-02-29 DIAGNOSIS — M899 Disorder of bone, unspecified: Secondary | ICD-10-CM | POA: Diagnosis present

## 2024-02-29 DIAGNOSIS — N281 Cyst of kidney, acquired: Secondary | ICD-10-CM

## 2024-02-29 DIAGNOSIS — I10 Essential (primary) hypertension: Secondary | ICD-10-CM | POA: Diagnosis present

## 2024-02-29 DIAGNOSIS — K5730 Diverticulosis of large intestine without perforation or abscess without bleeding: Secondary | ICD-10-CM | POA: Diagnosis present

## 2024-02-29 DIAGNOSIS — E785 Hyperlipidemia, unspecified: Secondary | ICD-10-CM | POA: Diagnosis present

## 2024-02-29 HISTORY — DX: Diverticulosis of large intestine without perforation or abscess without bleeding: K57.30

## 2024-02-29 HISTORY — DX: Hyperlipidemia, unspecified: E78.5

## 2024-02-29 LAB — URINALYSIS, ROUTINE W REFLEX MICROSCOPIC
Bilirubin Urine: NEGATIVE
Glucose, UA: NEGATIVE mg/dL
Ketones, ur: NEGATIVE mg/dL
Leukocytes,Ua: NEGATIVE
Nitrite: NEGATIVE
Protein, ur: NEGATIVE mg/dL
Specific Gravity, Urine: 1.02 (ref 1.005–1.030)
pH: 6.5 (ref 5.0–8.0)

## 2024-02-29 LAB — URINALYSIS, MICROSCOPIC (REFLEX)
Bacteria, UA: NONE SEEN
RBC / HPF: NONE SEEN RBC/hpf (ref 0–5)
Squamous Epithelial / HPF: NONE SEEN /HPF (ref 0–5)
WBC, UA: NONE SEEN WBC/hpf (ref 0–5)

## 2024-02-29 LAB — COMPREHENSIVE METABOLIC PANEL WITH GFR
ALT: 14 U/L (ref 0–44)
AST: 19 U/L (ref 15–41)
Albumin: 4.2 g/dL (ref 3.5–5.0)
Alkaline Phosphatase: 79 U/L (ref 38–126)
Anion gap: 11 (ref 5–15)
BUN: 15 mg/dL (ref 8–23)
CO2: 25 mmol/L (ref 22–32)
Calcium: 9.1 mg/dL (ref 8.9–10.3)
Chloride: 101 mmol/L (ref 98–111)
Creatinine, Ser: 0.98 mg/dL (ref 0.61–1.24)
GFR, Estimated: >60 mL/min (ref >60)
Glucose, Bld: 118 mg/dL — ABNORMAL HIGH (ref 70–99)
Potassium: 3.9 mmol/L (ref 3.5–5.1)
Sodium: 137 mmol/L (ref 135–145)
Total Bilirubin: 1.8 mg/dL — ABNORMAL HIGH (ref 0.0–1.2)
Total Protein: 7.1 g/dL (ref 6.5–8.1)

## 2024-02-29 LAB — CBC WITH DIFFERENTIAL/PLATELET
Abs Immature Granulocytes: 0.01 10*3/uL (ref 0.00–0.07)
Basophils Absolute: 0 10*3/uL (ref 0.0–0.1)
Basophils Relative: 0 %
Eosinophils Absolute: 0 10*3/uL (ref 0.0–0.5)
Eosinophils Relative: 0 %
HCT: 39.7 % (ref 39.0–52.0)
Hemoglobin: 12.8 g/dL — ABNORMAL LOW (ref 13.0–17.0)
Immature Granulocytes: 0 %
Lymphocytes Relative: 10 %
Lymphs Abs: 0.9 10*3/uL (ref 0.7–4.0)
MCH: 25 pg — ABNORMAL LOW (ref 26.0–34.0)
MCHC: 32.2 g/dL (ref 30.0–36.0)
MCV: 77.4 fL — ABNORMAL LOW (ref 80.0–100.0)
Monocytes Absolute: 0.5 10*3/uL (ref 0.1–1.0)
Monocytes Relative: 6 %
Neutro Abs: 7.6 10*3/uL (ref 1.7–7.7)
Neutrophils Relative %: 84 %
Platelets: 233 10*3/uL (ref 150–400)
RBC: 5.13 MIL/uL (ref 4.22–5.81)
RDW: 15 % (ref 11.5–15.5)
WBC: 9 10*3/uL (ref 4.0–10.5)
nRBC: 0 % (ref 0.0–0.2)

## 2024-02-29 LAB — LIPASE, BLOOD: Lipase: 48 U/L (ref 11–51)

## 2024-02-29 LAB — LACTIC ACID, PLASMA: Lactic Acid, Venous: 1 mmol/L (ref 0.5–1.9)

## 2024-02-29 LAB — PSA: Prostatic Specific Antigen: 5.67 ng/mL — ABNORMAL HIGH (ref 0.00–4.00)

## 2024-02-29 MED ORDER — MORPHINE SULFATE (PF) 4 MG/ML IV SOLN
4.0000 mg | Freq: Once | INTRAVENOUS | Status: AC
  Administered 2024-02-29: 4 mg via INTRAVENOUS
  Filled 2024-02-29: qty 1

## 2024-02-29 MED ORDER — LACTATED RINGERS IV BOLUS
1000.0000 mL | Freq: Once | INTRAVENOUS | Status: AC
  Administered 2024-02-29: 1000 mL via INTRAVENOUS

## 2024-02-29 MED ORDER — CARMEX CLASSIC LIP BALM EX OINT
TOPICAL_OINTMENT | CUTANEOUS | Status: DC | PRN
  Filled 2024-02-29: qty 10

## 2024-02-29 MED ORDER — MORPHINE SULFATE (PF) 2 MG/ML IV SOLN: 2.0000 mg | INTRAVENOUS | Status: DC | PRN

## 2024-02-29 MED ORDER — HYDRALAZINE HCL 20 MG/ML IJ SOLN: 10.0000 mg | INTRAMUSCULAR | Status: DC | PRN

## 2024-02-29 MED ORDER — ONDANSETRON HCL 4 MG PO TABS: 4.0000 mg | ORAL_TABLET | Freq: Four times a day (QID) | ORAL | Status: DC | PRN

## 2024-02-29 MED ORDER — LACTATED RINGERS IV BOLUS: 1000.0000 mL | Freq: Three times a day (TID) | INTRAVENOUS | Status: AC | PRN

## 2024-02-29 MED ORDER — POTASSIUM CHLORIDE IN NACL 20-0.9 MEQ/L-% IV SOLN
INTRAVENOUS | Status: AC
  Filled 2024-02-29 (×2): qty 1000

## 2024-02-29 MED ORDER — DIATRIZOATE MEGLUMINE & SODIUM 66-10 % PO SOLN
90.0000 mL | Freq: Once | ORAL | Status: AC
  Administered 2024-02-29: 90 mL via NASOGASTRIC
  Filled 2024-02-29: qty 90

## 2024-02-29 MED ORDER — ACETAMINOPHEN 325 MG PO TABS
650.0000 mg | ORAL_TABLET | Freq: Four times a day (QID) | ORAL | Status: DC | PRN
  Filled 2024-02-29: qty 2

## 2024-02-29 MED ORDER — ACETAMINOPHEN 650 MG RE SUPP: 650.0000 mg | Freq: Four times a day (QID) | RECTAL | Status: DC | PRN

## 2024-02-29 MED ORDER — SODIUM CHLORIDE 0.9 % IV BOLUS
1000.0000 mL | Freq: Once | INTRAVENOUS | Status: AC
  Administered 2024-02-29: 1000 mL via INTRAVENOUS

## 2024-02-29 MED ORDER — BISACODYL 10 MG RE SUPP
10.0000 mg | Freq: Every day | RECTAL | Status: DC
  Administered 2024-02-29: 10 mg via RECTAL
  Filled 2024-02-29: qty 1

## 2024-02-29 MED ORDER — ONDANSETRON HCL 4 MG/2ML IJ SOLN
4.0000 mg | Freq: Once | INTRAMUSCULAR | Status: AC
  Administered 2024-02-29: 4 mg via INTRAVENOUS
  Filled 2024-02-29: qty 2

## 2024-02-29 MED ORDER — MENTHOL 3 MG MT LOZG: 1.0000 | LOZENGE | OROMUCOSAL | Status: DC | PRN

## 2024-02-29 MED ORDER — HYDROMORPHONE HCL 1 MG/ML IJ SOLN
0.5000 mg | INTRAMUSCULAR | Status: DC | PRN
  Administered 2024-02-29 (×2): 1 mg via INTRAVENOUS
  Filled 2024-02-29 (×2): qty 1

## 2024-02-29 MED ORDER — ONDANSETRON HCL 4 MG/2ML IJ SOLN
4.0000 mg | Freq: Four times a day (QID) | INTRAMUSCULAR | Status: DC | PRN
  Administered 2024-02-29 (×2): 4 mg via INTRAVENOUS
  Filled 2024-02-29 (×2): qty 2

## 2024-02-29 MED ORDER — IOHEXOL 300 MG/ML  SOLN
100.0000 mL | Freq: Once | INTRAMUSCULAR | Status: AC | PRN
  Administered 2024-02-29: 100 mL via INTRAVENOUS

## 2024-02-29 MED ORDER — ONDANSETRON HCL 4 MG/2ML IJ SOLN: 4.0000 mg | Freq: Four times a day (QID) | INTRAMUSCULAR | Status: DC | PRN

## 2024-02-29 MED ORDER — PANTOPRAZOLE SODIUM 40 MG IV SOLR
40.0000 mg | Freq: Every day | INTRAVENOUS | Status: DC
  Administered 2024-02-29 – 2024-03-02 (×3): 40 mg via INTRAVENOUS
  Filled 2024-02-29 (×3): qty 10

## 2024-02-29 MED ORDER — METHOCARBAMOL 1000 MG/10ML IJ SOLN: 1000.0000 mg | Freq: Four times a day (QID) | INTRAMUSCULAR | Status: DC | PRN

## 2024-02-29 MED ORDER — MAGNESIUM SULFATE 2 GM/50ML IV SOLN
2.0000 g | Freq: Once | INTRAVENOUS | Status: AC
  Administered 2024-02-29: 2 g via INTRAVENOUS
  Filled 2024-02-29: qty 50

## 2024-02-29 MED ORDER — PROCHLORPERAZINE EDISYLATE 10 MG/2ML IJ SOLN: 5.0000 mg | INTRAMUSCULAR | Status: DC | PRN

## 2024-02-29 MED ORDER — NAPHAZOLINE-GLYCERIN 0.012-0.25 % OP SOLN: 1.0000 [drp] | Freq: Four times a day (QID) | OPHTHALMIC | Status: DC | PRN

## 2024-02-29 MED ORDER — PHENOL 1.4 % MT LIQD
2.0000 | OROMUCOSAL | Status: DC | PRN
  Administered 2024-03-01: 2 via OROMUCOSAL
  Filled 2024-02-29: qty 177

## 2024-02-29 MED ORDER — MAGIC MOUTHWASH: 15.0000 mL | Freq: Four times a day (QID) | ORAL | Status: DC | PRN

## 2024-02-29 MED ORDER — SALINE SPRAY 0.65 % NA SOLN: 1.0000 | Freq: Four times a day (QID) | NASAL | Status: DC | PRN

## 2024-02-29 NOTE — ED Provider Notes (Signed)
 [place] EMERGENCY DEPARTMENT AT [facility] Provider Note   CSN: [id number] Arrival date & time: 02/29/24  0703     History  Chief Complaint  Patient presents with   Abdominal Pain    Johnny Heath is a 78 y.o. male.  Pt is a 78 yo male with pmhx significant for htn, hld, and hx sbo from right inguinal hernia (repaired 10/24).  Pt developed diffuse abd pain on Saturday, 5/17.  He did vomit last night and this am.  No bm since 5/17.  No fevers.  No other sx.       Home Medications Prior to Admission medications   Medication Sig Start Date End Date Taking? Authorizing Provider  amLODipine (NORVASC) 5 MG tablet Take 5 mg by mouth as needed.    [provider]  atorvastatin (LIPITOR) 20 MG tablet Take 20 mg by mouth daily.    [provider]      Allergies    Penicillins    Review of Systems   Review of Systems  Gastrointestinal:  Positive for abdominal pain, nausea and vomiting.  All other systems reviewed and are negative.   Physical Exam Updated Vital Signs BP (!) 140/89   Pulse 79   Temp 98.8 F (37.1 C) (Oral)   Resp 14   Ht 5\' 10"  (1.778 m)   Wt 75.8 kg   SpO2 97%   BMI 23.96 kg/m  Physical Exam Vitals and nursing note reviewed.  Constitutional:      Appearance: He is well-developed.  HENT:     Head: Normocephalic and atraumatic.     Mouth/Throat:     Mouth: Mucous membranes are dry.  Eyes:     Extraocular Movements: Extraocular movements intact.     Pupils: Pupils are equal, round, and reactive to light.  Cardiovascular:     Rate and Rhythm: Normal rate and regular rhythm.     Heart sounds: Normal heart sounds.  Pulmonary:     Effort: Pulmonary effort is normal.     Breath sounds: Normal breath sounds.  Abdominal:     General: Abdomen is flat. Bowel sounds are normal.     Palpations: Abdomen is soft.     Tenderness: There is generalized abdominal tenderness.  Skin:    General: Skin is warm.     Capillary  Refill: Capillary refill takes less than 2 seconds.  Neurological:     General: No focal deficit present.     Mental Status: He is alert and oriented to person, place, and time.  Psychiatric:        Mood and Affect: Mood normal.        Behavior: Behavior normal.     ED Results / Procedures / Treatments   Labs (all labs ordered are listed, but only abnormal results are displayed) Labs Reviewed  CBC WITH DIFFERENTIAL/PLATELET - Abnormal; Notable for the following components:      Result Value   Hemoglobin 12.8 (*)    MCV 77.4 (*)    MCH 25.0 (*)    All other components within normal limits  COMPREHENSIVE METABOLIC PANEL WITH GFR - Abnormal; Notable for the following components:   Glucose, Bld 118 (*)    Total Bilirubin 1.8 (*)    All other components within normal limits  URINALYSIS, ROUTINE W REFLEX MICROSCOPIC - Abnormal; Notable for the following components:   Hgb urine dipstick SMALL (*)    All other components within normal limits  LIPASE, BLOOD  URINALYSIS,  MICROSCOPIC (REFLEX)  LACTIC ACID, PLASMA  PSA  LACTIC ACID, PLASMA    EKG EKG Interpretation Date/Time:  Monday Feb 29 2024 07:11:25 EDT Ventricular Rate:  73 PR Interval:  220 QRS Duration:  93 QT Interval:  396 QTC Calculation: 437 R Axis:   22  Text Interpretation: Sinus rhythm Prolonged PR interval Borderline T wave abnormalities No significant change since last tracing Confirmed by Sueellen Emery [phone]) on 02/29/2024 7:14:37 AM  Radiology CT ABDOMEN PELVIS W CONTRAST Result Date: 02/29/2024 CLINICAL DATA:  Acute abdominal pain starting on Saturday with vomiting last night and this morning. EXAM: CT ABDOMEN AND PELVIS WITH CONTRAST TECHNIQUE: Multidetector CT imaging of the abdomen and pelvis was performed using the standard protocol following bolus administration of intravenous contrast. RADIATION DOSE REDUCTION: This exam was performed according to the departmental dose-optimization program which  includes automated exposure control, adjustment of the mA and/or kV according to patient size and/or use of iterative reconstruction technique. CONTRAST:  OMNIPAQUE  IOHEXOL  300 MG/ML  SOLN COMPARISON:  05/31/2022 FINDINGS: Lower chest: Mild cardiomegaly. Subtle fullness of the distal esophagus at the gastroesophageal junction is probably incidental on image 17 of series 2, and less likely to be due to a small mass. Dependent atelectasis in both lower lobes. Hepatobiliary: Mild focal steatosis in segment 4 adjacent to the falciform ligament. Multiple gallstones are present measuring up to 2.1 cm in long axis. Pancreas: Unremarkable Spleen: Unremarkable Adrenals/Urinary Tract: Simple right renal cysts noted. A left kidney upper pole exophytic homogeneous lesion measuring 1.6 cm in diameter has an internal density of 36 Hounsfield units which is indeterminate for mass versus cyst. This previously measured 0.8 cm in diameter on 05/31/2022. A 2.7 cm in long axis left kidney lower pole lesion on image 39 of series 2 measures 25 Hounsfield units on portal venous phase images and is most likely a complex cyst. Adrenal glands unremarkable. Stomach/Bowel: Small bowel dilatation up to 4.2 cm extending down to a angulated loop transition point in the right lower quadrant shown on image 51 series 8 where there is an abrupt transition between dilated and nondilated bowel. Appearance favors small bowel obstruction due to adhesion. Trace amount of free fluid adjacent to the bowel loops and in the right paracolic gutter. No extraluminal gas or portal venous gas observed. No pneumatosis or definitely necrotic bowel currently. The contents of the small bowel approaching the site of obstruction have a stool like appearance. Sigmoid colon diverticulosis. Vascular/Lymphatic: Minimal iliac artery atheromatous vascular calcification. Reproductive: Substantial prostatomegaly with heterogeneous enhancement in the prostate gland,  including accentuated but nonspecific enhancement in the left posterior peripheral zone which could be due to prior inflammation or neoplasm. Other: No supplemental non-categorized findings. Musculoskeletal: 3 cm in long axis sclerotic lesion of the left iliac crest is nonspecific but sclerotic metastatic disease is not excluded as a possibility. 7 mm sclerotic lesion in the left iliac bone on image 73 series 2 is more likely to be a bone island based on morphology. Similar probable bone island in the left femoral head. IMPRESSION: 1. Small bowel obstruction with transition point in the right lower quadrant, probably due to adhesion given the angulated loop at the transition. Trace amount of free fluid adjacent to the bowel loops and in the right paracolic gutter. No extraluminal gas or portal venous gas observed. Surgical consultation recommended. 2. Substantial prostatomegaly with heterogeneous enhancement in the prostate gland, including accentuated but nonspecific enhancement in the left posterior peripheral zone which could be due  to prior inflammation or neoplasm. Correlate with PSA level and consider urology referral. 3. 3 cm in long axis sclerotic lesion of the left iliac crest is nonspecific but sclerotic metastatic disease is not excluded as a possibility. 4. Subtle fullness of the distal esophagus at the gastroesophageal junction is probably incidental and less likely to be due to a small mass. 5. Left kidney upper pole exophytic homogeneous lesion measuring 1.6 cm in diameter has an internal density of 36 Hounsfield units which is indeterminate for mass versus cyst. This previously measured 0.8 cm in diameter on 05/31/2022. Elective renal protocol CT or MRI with and without contrast is recommended in the nonemergent setting for further characterization, to exclude a renal cell carcinoma. 6. Sigmoid colon diverticulosis. 7. Mild cardiomegaly. 8. Minimal iliac artery atheromatous vascular calcification.  Electronically Signed   By: Freida Jes M.D.   On: 02/29/2024 10:49    Procedures Procedures    Medications Ordered in ED Medications  sodium chloride  0.9 % bolus 1,000 mL (has no administration in time range)  sodium chloride  0.9 % bolus 1,000 mL (0 mLs Intravenous Stopped 02/29/24 0929)  morphine  (PF) 4 MG/ML injection 4 mg (4 mg Intravenous Given 02/29/24 0735)  ondansetron  (ZOFRAN ) injection 4 mg (4 mg Intravenous Given 02/29/24 0736)  iohexol  (OMNIPAQUE ) 300 MG/ML solution 100 mL (100 mLs Intravenous Contrast Given 02/29/24 0915)  morphine  (PF) 4 MG/ML injection 4 mg (4 mg Intravenous Given 02/29/24 1116)    ED Course/ Medical Decision Making/ A&P                                 Medical Decision Making Amount and/or Complexity of Data Reviewed Labs: ordered. Radiology: ordered.  Risk Prescription drug management. Decision regarding hospitalization.   This patient presents to the ED for concern of abd pain, this involves an extensive number of treatment options, and is a complaint that carries with it a high risk of complications and morbidity.  The differential diagnosis includes sbo, constipation, appendicitis, cholecystitis, diverticulitis   Co morbidities that complicate the patient evaluation  Htn, hld, sbo   Additional history obtained:  Additional history obtained from epic chart review External records from outside source obtained and reviewed including son   Lab Tests:  I Ordered, and personally interpreted labs.  The pertinent results include:  cbc nl other than hgb sl low at 12.8, cmp nl, lip nl, ua nl   Imaging Studies ordered:  I ordered imaging studies including ct abd/pelvis  I independently visualized and interpreted imaging which showed  1. Small bowel obstruction with transition point in the right lower  quadrant, probably due to adhesion given the angulated loop at the  transition. Trace amount of free fluid adjacent to the bowel loops   and in the right paracolic gutter. No extraluminal gas or portal  venous gas observed. Surgical consultation recommended.  2. Substantial prostatomegaly with heterogeneous enhancement in the  prostate gland, including accentuated but nonspecific enhancement in  the left posterior peripheral zone which could be due to prior  inflammation or neoplasm. Correlate with PSA level and consider  urology referral.  3. 3 cm in long axis sclerotic lesion of the left iliac crest is  nonspecific but sclerotic metastatic disease is not excluded as a  possibility.  4. Subtle fullness of the distal esophagus at the gastroesophageal  junction is probably incidental and less likely to be due to a small  mass.  5. Left kidney upper pole exophytic homogeneous lesion measuring 1.6  cm in diameter has an internal density of 36 Hounsfield units which  is indeterminate for mass versus cyst. This previously measured 0.8  cm in diameter on 05/31/2022. Elective renal protocol CT or MRI with  and without contrast is recommended in the nonemergent setting for  further characterization, to exclude a renal cell carcinoma.  6. Sigmoid colon diverticulosis.  7. Mild cardiomegaly.  8. Minimal iliac artery atheromatous vascular calcification.   I agree with the radiologist interpretation   Cardiac Monitoring:  The patient was maintained on a cardiac monitor.  I personally viewed and interpreted the cardiac monitored which showed an underlying rhythm of: nsr   Medicines ordered and prescription drug management:  I ordered medication including ivfs/morphine /zofran   for sx  Reevaluation of the patient after these medicines showed that the patient improved I have reviewed the patients home medicines and have made adjustments as needed   Test Considered:  ct   Critical Interventions:  ng   Consultations Obtained:  I requested consultation with the surgery (PA Loetta Ringer,  and discussed lab and imaging  findings as well as pertinent plan - they will consult.  They recommend NG to [facility]. Pt d/w Dr. Bonita Bussing ([facility]) for admission   Problem List / ED Course:  SBO:  Surgery consulted.  Medicine adm. Enlarged prostate:  PSA sent L kidney cyst:  this cyst has doubled since last CT in 2023.  Pt will need further eval of this on a nonemergent basis   Reevaluation:  After the interventions noted above, I reevaluated the patient and found that they have :improved   Social Determinants of Health:  Lives at home   Dispostion:  After consideration of the diagnostic results and the patients response to treatment, I feel that the patent would benefit from admission.          Final Clinical Impression(s) / ED Diagnoses Final diagnoses:  Small bowel obstruction (HCC)  Enlarged prostate  Acquired cyst of kidney    Rx / DC Orders ED Discharge Orders     None         Sueellen Emery, MD 02/29/24 1149

## 2024-02-29 NOTE — Plan of Care (Signed)
   Problem: Clinical Measurements: Goal: Cardiovascular complication will be avoided Outcome: Progressing

## 2024-02-29 NOTE — H&P (Signed)
 History and Physical    Patient: Johnny Heath NUU:[id number] DOB: 1946/04/04 DOA: 02/29/2024 DOS: the patient was seen and examined on 02/29/2024 PCP: [facility]  Patient coming from: Home  Chief Complaint:  Chief Complaint  Patient presents with   Abdominal Pain   HPI: Johnny Heath is a 78 y.o. male with medical history significant of hypertension, hyperlipidemia, history of small bowel obstruction from right inguinal hernia which was repaired in October 2024 who presented to the emergency department with complaints of diffuse abdominal pain since Saturday associated with 2 episodes of emesis, but no diarrhea constipation, melena or hematochezia.  No flank pain, dysuria, frequency or hematuria. He denied fever, chills, rhinorrhea, sore throat, wheezing or hemoptysis.  No chest pain, palpitations, diaphoresis, PND, orthopnea or pitting edema of the lower extremities.  No polyuria, polydipsia, polyphagia or blurred vision.   Lab work: Urinalysis shows small hemoglobin, but was otherwise unremarkable.  CBC showed a white count of 9.0 with 84% neutrophils, hemoglobin 12.8 g/dL with an MCV of 34.7 fL and platelets 233.  Lipase and lactic acid were normal.  CMP showed a glucose of 118 and total bilirubin 1.8 mg/dL, the rest of the CMP measurements were normal.  Imaging: CT abdomen/pelvis with contrast shows small bowel obstruction with transition point in the right lower quadrant, probably due to condition given the angulated loop at the transition.  Substantial prostatomegaly with heterogenous enhancement in the prostate gland, including mesentery but nonspecific enhancement in the left posterior peripheral zone which could be due to prior inflammation or neoplasm.  Correlate with PSA level and consider urology referral.  Subtle fullness of the distal esophagus of the GE junction probably incidental and less likely to be due to a small mass.  Left kidney upper pole exophytic homogeneous  lesion measuring 1.6 cm in diameter this was previously 0.9 cm in diameter in August 2023.  Elected renal protocol CT or MRI with and without contrast is recommended in the nonemergent setting to exclude renal cell carcinoma.  Sigmoid colon diverticulosis.  Mild cardiomegaly.  Minimal iliac artery atheromatous vascular calcification.   ED course: Initial vital signs were temperature 98.8 F, pulse 70, respiration 24, BP 133/92 mmHg O2 sat 98% on room air.  The patient received morphine  4 mg IVP x 2, ondansetron  4 mg IVP x 1 and 2000 mL of normal saline bolus.  General surgery was consulted and recommended NG tube placement.  Review of Systems: As mentioned in the history of present illness. All other systems reviewed and are negative. Past Medical History:  Diagnosis Date   Hyperlipidemia    Hypertension    Skin lesion of scalp    Past Surgical History:  Procedure Laterality Date   HERNIA REPAIR     PROSTATE SURGERY  03/14/2011   Social History:  reports that he has never smoked. He has never used smokeless tobacco. He reports that he does not drink alcohol and does not use drugs.  Allergies  Allergen Reactions   Penicillins Itching    History reviewed. No pertinent family history.  Prior to Admission medications   Medication Sig Start Date End Date Taking? Authorizing Provider  amLODipine (NORVASC) 5 MG tablet Take 5 mg by mouth as needed.    [provider]  atorvastatin (LIPITOR) 20 MG tablet Take 20 mg by mouth daily.    [provider]    Physical Exam: Vitals:   02/29/24 1200 02/29/24 1202 02/29/24 1215 02/29/24 1322  BP: (!) 134/92  128/85  Pulse: 80  68 64  Resp: (!) 23  19 20   Temp:  98.6 F (37 C)    TempSrc:      SpO2: 94%  95% 97%  Weight:      Height:       Physical Exam Vitals and nursing note reviewed.  Constitutional:      General: He is awake. He is not in acute distress.    Appearance: He is well-developed. He is ill-appearing.   HENT:     Head: Normocephalic.     Comments: NG tube in place.    Nose: No rhinorrhea.     Mouth/Throat:     Mouth: Mucous membranes are dry.  Eyes:     General: No scleral icterus.    Pupils: Pupils are equal, round, and reactive to light.  Neck:     Vascular: No JVD.  Cardiovascular:     Rate and Rhythm: Normal rate and regular rhythm.     Heart sounds: S1 normal and S2 normal.  Pulmonary:     Effort: Pulmonary effort is normal.     Breath sounds: Normal breath sounds.  Abdominal:     General: Bowel sounds are decreased. There is distension.     Palpations: Abdomen is soft.     Tenderness: There is no abdominal tenderness. There is no guarding or rebound.  Musculoskeletal:     Cervical back: Neck supple.     Right lower leg: No edema.     Left lower leg: No edema.  Skin:    General: Skin is warm and dry.  Neurological:     General: No focal deficit present.     Mental Status: He is alert and oriented to person, place, and time.  Psychiatric:        Mood and Affect: Mood normal.        Behavior: Behavior normal. Behavior is cooperative.     Data Reviewed:  Results are pending, will review when available.  EKG: Vent. rate 73 BPM PR interval 220 ms QRS duration 93 ms QT/QTcB 396/437 ms P-R-T axes -5 22 20  Sinus rhythm Prolonged PR interval Borderline T wave abnormalities  Assessment and Plan: Principal Problem:   SBO (small bowel obstruction) (HCC) Inpatient/MedSurg. Keep NPO. Continue NTG at [facility]. Continue IV fluids. Analgesics as needed. Antiemetics as needed. Pantoprazole  40 mg IVP every 24 hours. Keep electrolytes optimized. Follow-up CBC and CMP in AM. Follow-up imaging in the morning. General surgery input appreciated.  Active Problems:   Hypertension Currently NPO. Parenteral hydralazine  as needed.    HLD (hyperlipidemia) Resume atorvastatin once cleared for oral intake.    Colon, diverticulosis Diet modifications    Elevated PSA    Enlarged prostate Follow-up with urology as an outpatient.    Left kidney exophytic mass Will need dedicated MRI as an outpatient.    Advance Care Planning:   Code Status: Full Code   Consults: [facility].  Family Communication:   Severity of Illness: The appropriate patient status for this patient is INPATIENT. Inpatient status is judged to be reasonable and necessary in order to provide the required intensity of service to ensure the patient's safety. The patient's presenting symptoms, physical exam findings, and initial radiographic and laboratory data in the context of their chronic comorbidities is felt to place them at high risk for further clinical deterioration. Furthermore, it is not anticipated that the patient will be medically stable for discharge from the hospital within 2 midnights of admission.   *  I certify that at the point of admission it is my clinical judgment that the patient will require inpatient hospital care spanning beyond 2 midnights from the point of admission due to high intensity of service, high risk for further deterioration and high frequency of surveillance required.*  Author: Danice Dural, MD 02/29/2024 1:23 PM  For on call review [email or web address].   This document was prepared using Dragon voice recognition software and may contain some unintended transcription errors.

## 2024-02-29 NOTE — Progress Notes (Signed)
 Plan of Care Note for accepted transfer   Patient: Johnny Heath MRN: [id number]   DOA: 02/29/2024  Facility requesting transfer: [facility].  Requesting Provider: Sueellen Emery, MD. Reason for transfer: Small bowel obstruction. Facility course:  78 year old male with a past medical history of hypertension, hyperlipidemia, history of small bowel obstruction from right inguinal hernia which was repaired in October 2024 who presented to the emergency department with complaints of diffuse abdominal pain since Saturday associated with 2 episodes of emesis, but no diarrhea.  Case presented to general surgery who recommended admission.  Plan of care: The patient is accepted for admission to Med-surg  unit, at [facility].  Please notify [facility] surgery when the patient arrives to the hospital..   Author: Danice Dural, MD 02/29/2024  Check [email or web address] for on-call coverage.  Nursing staff, Please call [facility] Admits & Consults System-Wide number on Amion as soon as patient's arrival, so appropriate admitting provider can evaluate the pt.

## 2024-02-29 NOTE — ED Notes (Signed)
 [facility] at bedside

## 2024-02-29 NOTE — ED Triage Notes (Signed)
 Started having generalized abdominal pain Saturday and vomiting last night and this morning. Denies diarrhea. Last BM Saturday. Hx of hernia repair in October.

## 2024-02-29 NOTE — Consult Note (Signed)
 Consult Note  Johnny Heath 1945/12/20  [id number].    Requesting MD: Sueellen Emery, MD Chief Complaint/Reason for Consult: SBO HPI:  Patient is a 78 year old male who presented to [facility] earlier today with abdominal pain and n/v since Saturday. Pain is generalized. Emesis has been non-bloody. Last BM was Saturday and he did not note anything remarkable about it, not passing flatus since then either. He is generally regular and has a BM daily. He underwent recent robotic RIH repair in [facility] 07/2023 and has recovered well since then. Denies fever, chills, chest pain, SOB, unusual food ingestion, sick contacts. He currently is still having some generalized abdominal pain and not passing any flatus. NGT was placed at [facility] prior to transport. Patient speaks Hindi but understands/speaks some English as well and his family member assisted with translation. Allergy to PCNs. No other previous abdominal surgery. Not on blood thinners at home.   ROS: Negative other than HPI  History reviewed. No pertinent family history.  Past Medical History:  Diagnosis Date   Hyperlipidemia    Hypertension    Skin lesion of scalp     Past Surgical History:  Procedure Laterality Date   HERNIA REPAIR     PROSTATE SURGERY  03/14/2011    Social History:  reports that he has never smoked. He has never used smokeless tobacco. He reports that he does not drink alcohol and does not use drugs.  Allergies:  Allergies  Allergen Reactions   Penicillins Itching    (Not in a hospital admission)   Blood pressure (!) 134/92, pulse 68, temperature 98.6 F (37 C), resp. rate 19, height 5\' 10"  (1.778 m), weight 75.8 kg, SpO2 95%. Physical Exam:  General: pleasant, WD, thin male who is laying in bed in NAD HEENT: head is normocephalic, atraumatic.  Sclera are noninjected.  Pupils equal and round.  Ears and nose without any masses or lesions.  Mouth is pink and moist Heart: regular, rate, and rhythm.  Palpable radial and pedal pulses bilaterally Lungs: No wheezes, rhonchi, or rales noted.  Respiratory effort nonlabored Abd: soft, mild generalized ttp without peritonitis, mild distention, NGT with minimal drainage so far, prior surgical incisions well healed.  MS: all 4 extremities are symmetrical with no cyanosis, clubbing, or edema. Skin: warm and dry with no masses, lesions, or rashes Neuro: Cranial nerves 2-12 grossly intact, sensation is normal throughout Psych: A&Ox3 with an appropriate affect.   Results for orders placed or performed during the hospital encounter of 02/29/24 (from the past 48 hours)  CBC with Differential     Status: Abnormal   Collection Time: 02/29/24  7:39 AM  Result Value Ref Range   WBC 9.0 4.0 - 10.5 K/uL   RBC 5.13 4.22 - 5.81 MIL/uL   Hemoglobin 12.8 (L) 13.0 - 17.0 g/dL   HCT 63.8 75.6 - 43.3 %   MCV 77.4 (L) 80.0 - 100.0 fL   MCH 25.0 (L) 26.0 - 34.0 pg   MCHC 32.2 30.0 - 36.0 g/dL   RDW 29.5 18.8 - 41.6 %   Platelets 233 150 - 400 K/uL   nRBC 0.0 0.0 - 0.2 %   Neutrophils Relative % 84 %   Neutro Abs 7.6 1.7 - 7.7 K/uL   Lymphocytes Relative 10 %   Lymphs Abs 0.9 0.7 - 4.0 K/uL   Monocytes Relative 6 %   Monocytes Absolute 0.5 0.1 - 1.0 K/uL   Eosinophils Relative 0 %   Eosinophils  Absolute 0.0 0.0 - 0.5 K/uL   Basophils Relative 0 %   Basophils Absolute 0.0 0.0 - 0.1 K/uL   Immature Granulocytes 0 %   Abs Immature Granulocytes 0.01 0.00 - 0.07 K/uL    Comment: Performed at [facility], [address] [place], [place] [address]  Comprehensive metabolic panel     Status: Abnormal   Collection Time: 02/29/24  7:39 AM  Result Value Ref Range   Sodium 137 135 - 145 mmol/L   Potassium 3.9 3.5 - 5.1 mmol/L   Chloride 101 98 - 111 mmol/L   CO2 25 22 - 32 mmol/L   Glucose, Bld 118 (H) 70 - 99 mg/dL    Comment: Glucose reference range applies only to samples taken after fasting for at least 8 hours.   BUN 15 8 - 23 mg/dL    Creatinine, Ser 6.04 0.61 - 1.24 mg/dL   Calcium 9.1 8.9 - 54.0 mg/dL   Total Protein 7.1 6.5 - 8.1 g/dL   Albumin 4.2 3.5 - 5.0 g/dL   AST 19 15 - 41 U/L   ALT 14 0 - 44 U/L   Alkaline Phosphatase 79 38 - 126 U/L   Total Bilirubin 1.8 (H) 0.0 - 1.2 mg/dL   GFR, Estimated >98 >11 mL/min    Comment: (NOTE) Calculated using the CKD-EPI Creatinine Equation (2021)    Anion gap 11 5 - 15    Comment: Performed at [facility], [address] [place], [place] [address]  Lipase, blood     Status: None   Collection Time: 02/29/24  7:39 AM  Result Value Ref Range   Lipase 48 11 - 51 U/L    Comment: Performed at [facility], [address] [place], [place] [address]  Urinalysis, Routine w reflex microscopic -Urine, Clean Catch     Status: Abnormal   Collection Time: 02/29/24  8:15 AM  Result Value Ref Range   Color, Urine YELLOW YELLOW   APPearance CLEAR CLEAR   Specific Gravity, Urine 1.020 1.005 - 1.030   pH 6.5 5.0 - 8.0   Glucose, UA NEGATIVE NEGATIVE mg/dL   Hgb urine dipstick SMALL (A) NEGATIVE   Bilirubin Urine NEGATIVE NEGATIVE   Ketones, ur NEGATIVE NEGATIVE mg/dL   Protein, ur NEGATIVE NEGATIVE mg/dL   Nitrite NEGATIVE NEGATIVE   Leukocytes,Ua NEGATIVE NEGATIVE    Comment: Performed at [facility], [address] [place], [place] [address]  Urinalysis, Microscopic (reflex)     Status: None   Collection Time: 02/29/24  8:15 AM  Result Value Ref Range   RBC / HPF NONE SEEN 0 - 5 RBC/hpf   WBC, UA NONE SEEN 0 - 5 WBC/hpf   Bacteria, UA NONE SEEN NONE SEEN   Squamous Epithelial / HPF NONE SEEN 0 - 5 /HPF    Comment: Performed at [facility], [address] [place], [place] [address]  Lactic acid, plasma     Status: None   Collection Time: 02/29/24 11:19 AM  Result Value Ref Range   Lactic Acid, Venous 1.0 0.5 - 1.9 mmol/L    Comment: Performed at [facility], [address] [place], [place] [address]   CT  ABDOMEN PELVIS W CONTRAST Result Date: 02/29/2024 CLINICAL DATA:  Acute abdominal pain starting on Saturday with vomiting last night and this morning. EXAM: CT ABDOMEN AND PELVIS WITH CONTRAST TECHNIQUE: Multidetector CT imaging of  the abdomen and pelvis was performed using the standard protocol following bolus administration of intravenous contrast. RADIATION DOSE REDUCTION: This exam was performed according to the departmental dose-optimization program which includes automated exposure control, adjustment of the mA and/or kV according to patient size and/or use of iterative reconstruction technique. CONTRAST:  OMNIPAQUE  IOHEXOL  300 MG/ML  SOLN COMPARISON:  05/31/2022 FINDINGS: Lower chest: Mild cardiomegaly. Subtle fullness of the distal esophagus at the gastroesophageal junction is probably incidental on image 17 of series 2, and less likely to be due to a small mass. Dependent atelectasis in both lower lobes. Hepatobiliary: Mild focal steatosis in segment 4 adjacent to the falciform ligament. Multiple gallstones are present measuring up to 2.1 cm in long axis. Pancreas: Unremarkable Spleen: Unremarkable Adrenals/Urinary Tract: Simple right renal cysts noted. A left kidney upper pole exophytic homogeneous lesion measuring 1.6 cm in diameter has an internal density of 36 Hounsfield units which is indeterminate for mass versus cyst. This previously measured 0.8 cm in diameter on 05/31/2022. A 2.7 cm in long axis left kidney lower pole lesion on image 39 of series 2 measures 25 Hounsfield units on portal venous phase images and is most likely a complex cyst. Adrenal glands unremarkable. Stomach/Bowel: Small bowel dilatation up to 4.2 cm extending down to a angulated loop transition point in the right lower quadrant shown on image 51 series 8 where there is an abrupt transition between dilated and nondilated bowel. Appearance favors small bowel obstruction due to adhesion. Trace amount of free fluid adjacent  to the bowel loops and in the right paracolic gutter. No extraluminal gas or portal venous gas observed. No pneumatosis or definitely necrotic bowel currently. The contents of the small bowel approaching the site of obstruction have a stool like appearance. Sigmoid colon diverticulosis. Vascular/Lymphatic: Minimal iliac artery atheromatous vascular calcification. Reproductive: Substantial prostatomegaly with heterogeneous enhancement in the prostate gland, including accentuated but nonspecific enhancement in the left posterior peripheral zone which could be due to prior inflammation or neoplasm. Other: No supplemental non-categorized findings. Musculoskeletal: 3 cm in long axis sclerotic lesion of the left iliac crest is nonspecific but sclerotic metastatic disease is not excluded as a possibility. 7 mm sclerotic lesion in the left iliac bone on image 73 series 2 is more likely to be a bone island based on morphology. Similar probable bone island in the left femoral head. IMPRESSION: 1. Small bowel obstruction with transition point in the right lower quadrant, probably due to adhesion given the angulated loop at the transition. Trace amount of free fluid adjacent to the bowel loops and in the right paracolic gutter. No extraluminal gas or portal venous gas observed. Surgical consultation recommended. 2. Substantial prostatomegaly with heterogeneous enhancement in the prostate gland, including accentuated but nonspecific enhancement in the left posterior peripheral zone which could be due to prior inflammation or neoplasm. Correlate with PSA level and consider urology referral. 3. 3 cm in long axis sclerotic lesion of the left iliac crest is nonspecific but sclerotic metastatic disease is not excluded as a possibility. 4. Subtle fullness of the distal esophagus at the gastroesophageal junction is probably incidental and less likely to be due to a small mass. 5. Left kidney upper pole exophytic homogeneous lesion  measuring 1.6 cm in diameter has an internal density of 36 Hounsfield units which is indeterminate for mass versus cyst. This previously measured 0.8 cm in diameter on 05/31/2022. Elective renal protocol CT or MRI with and without contrast is recommended in the nonemergent setting  for further characterization, to exclude a renal cell carcinoma. 6. Sigmoid colon diverticulosis. 7. Mild cardiomegaly. 8. Minimal iliac artery atheromatous vascular calcification. Electronically Signed   By: Freida Jes M.D.   On: 02/29/2024 10:49      Assessment/Plan SBO - Hx of recent robotic right inguinal hernia repair at [facility] 07/29/23 - CT today with SBO with transition in the RLQ, trace amount free fluid adjacent to this, substantial prostatomegaly, sclerotic lesion of left iliac crest can't exclude metastatic lesion, subtle fullness of distal esophagus and GE junction, L renal lesion indeterminate for mass vs cyst - NGT placed at [facility] - recommend SBO protocol  - workup of prostatomegaly and left iliac and renal lesions per primary attending  - no indication for emergent surgical intervention at this time but we will follow   FEN: NPO, NGT to LIWS, IVF per [facility] VTE: ok to have SQH or LMWH from surgical standpoint ID: no current abx  - per [facility] -  HTN HLD Prostatomegaly - hx of TURP in 2012 by Dr. Ranae Burrow, PSA elevated, unsure of significance of that    I reviewed ED provider notes, last 24 h vitals and pain scores, last 48 h intake and output, last 24 h labs and trends, and last 24 h imaging results.  This care required high  level of medical decision making.   Annetta Killian, [facility] 02/29/2024, 12:51 PM Please see Amion for pager number during day hours 7:00am-4:30pm

## 2024-03-01 ENCOUNTER — Inpatient Hospital Stay (HOSPITAL_COMMUNITY)

## 2024-03-01 DIAGNOSIS — K56609 Unspecified intestinal obstruction, unspecified as to partial versus complete obstruction: Secondary | ICD-10-CM | POA: Diagnosis not present

## 2024-03-01 LAB — COMPREHENSIVE METABOLIC PANEL WITH GFR
ALT: 12 U/L (ref 0–44)
AST: 13 U/L — ABNORMAL LOW (ref 15–41)
Albumin: 3.1 g/dL — ABNORMAL LOW (ref 3.5–5.0)
Alkaline Phosphatase: 55 U/L (ref 38–126)
Anion gap: 7 (ref 5–15)
BUN: 18 mg/dL (ref 8–23)
CO2: 25 mmol/L (ref 22–32)
Calcium: 8 mg/dL — ABNORMAL LOW (ref 8.9–10.3)
Chloride: 108 mmol/L (ref 98–111)
Creatinine, Ser: 0.85 mg/dL (ref 0.61–1.24)
GFR, Estimated: >60 mL/min (ref >60)
Glucose, Bld: 106 mg/dL — ABNORMAL HIGH (ref 70–99)
Potassium: 3.9 mmol/L (ref 3.5–5.1)
Sodium: 140 mmol/L (ref 135–145)
Total Bilirubin: 1.9 mg/dL — ABNORMAL HIGH (ref 0.0–1.2)
Total Protein: 5.9 g/dL — ABNORMAL LOW (ref 6.5–8.1)

## 2024-03-01 LAB — CBC
HCT: 37.9 % — ABNORMAL LOW (ref 39.0–52.0)
Hemoglobin: 11.5 g/dL — ABNORMAL LOW (ref 13.0–17.0)
MCH: 25 pg — ABNORMAL LOW (ref 26.0–34.0)
MCHC: 30.3 g/dL (ref 30.0–36.0)
MCV: 82.4 fL (ref 80.0–100.0)
Platelets: 232 10*3/uL (ref 150–400)
RBC: 4.6 MIL/uL (ref 4.22–5.81)
RDW: 15.3 % (ref 11.5–15.5)
WBC: 8.1 10*3/uL (ref 4.0–10.5)
nRBC: 0 % (ref 0.0–0.2)

## 2024-03-01 LAB — MAGNESIUM: Magnesium: 2.5 mg/dL — ABNORMAL HIGH (ref 1.7–2.4)

## 2024-03-01 MED ORDER — POTASSIUM CHLORIDE IN NACL 20-0.9 MEQ/L-% IV SOLN
INTRAVENOUS | Status: AC
  Filled 2024-03-01: qty 1000

## 2024-03-01 NOTE — Hospital Course (Addendum)
 78 year old man presenting with abdominal pain, vomiting.  Admitted for small bowel obstruction.  Seen by general surgery, it managed conservatively with resolution.  Tolerating diet.  Discharged home in good condition.  Consultants General surgery  Procedures/Events None

## 2024-03-01 NOTE — Assessment & Plan Note (Signed)
 Also incidental finding of 1.6 cm exophytic left kidney mass.  This is enlarged since 05/31/2022. - Recommend outpatient renal protocol CT or MRI with PCP

## 2024-03-01 NOTE — Assessment & Plan Note (Signed)
 Resume Lipitor.

## 2024-03-01 NOTE — Assessment & Plan Note (Signed)
 BP normal - Hold amlodipine

## 2024-03-01 NOTE — Progress Notes (Addendum)
  Progress Note   Patient: Johnny Heath ZOX:[id number] DOB: October 31, 1945 DOA: 02/29/2024     1 DOS: the patient was seen and examined on 03/01/2024 at 9:54AM      Brief hospital course: 78 y.o. M with HTN, HLD and SBO due to incarcerated inguinal hernia last Oct who presented with few days cramping, emesis.  In the ER, CT showed SBO.  Admitted and NG placed.     Assessment and Plan: * SBO (small bowel obstruction) (HCC) CT shows SBO with transition point.  NG placed, SB protocol started.  This morning, contast through to the colon.  Having some BMs. - Surgery have clamped the tube and started clears - If stable, advance to FLD today - Tube out tomorrow and possibly home      Elevated PSA Known enlarged prostate.  On CT abdomen and pelvis here, noted finding of heterogenous enhancement of the prostate.  UA normal, no pelvic pain, doubt prostatitis.  PSA elevated - Follow-up with Dr. Inga Manges urology recommended - Forward DC summary to Dr. Inga Manges  Left kidney exophytic mass Also incidental finding of 1.6 cm exophytic left kidney mass.  This is enlarged since 05/31/2022. - Recommend outpatient renal protocol CT or MRI with PCP with Dr. Inga Manges  HLD (hyperlipidemia) - Resume Lipitor  Hypertension BP normal - Hold amlodipine          Subjective: Feeling better, having some bowel movements.  No fever, no vomiting.  NG tube still in place.     Physical Exam: BP 112/75 (BP Location: Left Arm)   Pulse 65   Temp 98.5 F (36.9 C) (Oral)   Resp 16   Ht 5\' 10"  (1.778 m)   Wt 75.8 kg   SpO2 96%   BMI 23.96 kg/m   Elderly adult male, lying in bed, interactive and appropriate RRR, no murmurs, no peripheral edema Respiratory rate normal, lungs clear without rales or wheezes Abdomen soft, mild tenderness to palpation, no guarding Attention normal, affect pleasant, judgment and insight appear normal  Data Reviewed: CMP unremarkable Tbili minimally elevated Renal function  normal CBC with mild anemia PSA 5    Family Communication: son and friend at bedside    Disposition: Status is: Inpatient 78 yo M with recent bowel surgery, admitted with SBO  Improving quickly, likely home tomorrow.  Will need PCP follow up for incidentalomas.        Author: Ephriam Hashimoto, MD 03/01/2024 3:14 PM  For on call review [email or web address].

## 2024-03-01 NOTE — Progress Notes (Deleted)
   03/01/24 0955  TOC Brief Assessment  Insurance and Status Reviewed  Patient has primary care physician Yes  Home environment has been reviewed return home w/ spouse  Prior level of function: independent  Prior/Current Home Services No current home services  Social Drivers of Health Review SDOH reviewed no interventions necessary  Readmission risk has been reviewed Yes  Transition of care needs transition of care needs identified, TOC will continue to follow

## 2024-03-01 NOTE — Assessment & Plan Note (Signed)
 CT abdomen and pelvis showed incidental finding of heterogenous enhancement of the prostate.  UA normal, no pelvic pain, doubt prostatitis.  PSA elevated - Follow-up with urology recommended

## 2024-03-01 NOTE — Progress Notes (Signed)
   03/01/24 1231  TOC Brief Assessment  Insurance and Status Reviewed  Patient has primary care physician Yes  Home environment has been reviewed resides in private residence with spouse  Prior level of function: Independent  Prior/Current Home Services No current home services  Social Drivers of Health Review SDOH reviewed no interventions necessary  Readmission risk has been reviewed Yes  Transition of care needs no transition of care needs at this time

## 2024-03-01 NOTE — Plan of Care (Signed)
   Problem: Coping: Goal: Level of anxiety will decrease Outcome: Progressing

## 2024-03-01 NOTE — Assessment & Plan Note (Signed)
 CT shows SBO with transition point.  NG placed, SB protocol started.  This morning, contast through to the colon.  Having some BMs. - Surgery have clamped the tube and started clears - If stable, advance to FLD today - Tube out tomorrow and possibly home

## 2024-03-01 NOTE — Progress Notes (Signed)
 [facility] Progress Note     Subjective: CC:  Having flatus. Reports 6 BMs since 11 pm. Denies abd pain. +flatus.  Says he is vegetarian  Objective: Vital signs in last 24 hours: Temp:  [97.7 F (36.5 C)-98.6 F (37 C)] 97.7 F (36.5 C) (05/20 1034) Pulse Rate:  [63-80] 67 (05/20 1034) Resp:  [16-23] 16 (05/20 1034) BP: (112-134)/(72-92) 113/76 (05/20 1034) SpO2:  [93 %-97 %] 95 % (05/20 1034) Last BM Date : 03/01/24  Intake/Output from previous day: 05/19 0701 - 05/20 0700 In: 2231.6 [I.V.:1111.3; NG/GT:120; IV Piggyback:1000.3] Out: 790 [Urine:340; Emesis/NG output:450] Intake/Output this shift: Total I/O In: 100 [NG/GT:100] Out: -   PE: Gen:  Alert, NAD, pleasant Card:  Regular rate and rhythm Pulm:  Normal effort, clear to auscultation bilaterally Abd: Soft, non-tender, non-distended,   NG with dark biliious effluent, 450 mL Psych: A&Ox3   Lab Results:  Recent Labs    02/29/24 0739 03/01/24 0458  WBC 9.0 8.1  HGB 12.8* 11.5*  HCT 39.7 37.9*  PLT 233 232   BMET Recent Labs    02/29/24 0739 03/01/24 0458  NA 137 140  K 3.9 3.9  CL 101 108  CO2 25 25  GLUCOSE 118* 106*  BUN 15 18  CREATININE 0.98 0.85  CALCIUM 9.1 8.0*   PT/INR No results for input(s): "LABPROT", "INR" in the last 72 hours. CMP     Component Value Date/Time   NA 140 03/01/2024 0458   K 3.9 03/01/2024 0458   CL 108 03/01/2024 0458   CO2 25 03/01/2024 0458   GLUCOSE 106 (H) 03/01/2024 0458   BUN 18 03/01/2024 0458   CREATININE 0.85 03/01/2024 0458   CALCIUM 8.0 (L) 03/01/2024 0458   PROT 5.9 (L) 03/01/2024 0458   ALBUMIN 3.1 (L) 03/01/2024 0458   AST 13 (L) 03/01/2024 0458   ALT 12 03/01/2024 0458   ALKPHOS 55 03/01/2024 0458   BILITOT 1.9 (H) 03/01/2024 0458   GFRNONAA >60 03/01/2024 0458   GFRAA 84 (L) 11/13/2014 0925   Lipase     Component Value Date/Time   LIPASE 48 02/29/2024 0739       Studies/Results: DG Abd Portable 1V-Small Bowel  Obstruction Protocol-initial, 8 hr delay Result Date: 02/29/2024 CLINICAL DATA:  Small bowel cortical, 8 hours post contrast. EXAM: PORTABLE ABDOMEN - 1 VIEW COMPARISON:  CT abdomen and pelvis 02/29/2024. FINDINGS: Diffusely dilated small bowel loops are again seen measuring up to 5.2 cm. Contrast is seen throughout dilated small bowel, but is not seen within nondilated small bowel or within the colon. Contrast is seen in the bladder. A nasogastric tube tip is in the body of the stomach. IMPRESSION: Findings compatible with small-bowel obstruction. Electronically Signed   By: Tyron Gallon M.D.   On: 02/29/2024 23:39   DG Abdomen 1 View Result Date: 02/29/2024 CLINICAL DATA:  NG tube placement EXAM: ABDOMEN - 1 VIEW COMPARISON:  CT earlier today FINDINGS: Tip and side port of the enteric tube below the diaphragm in the stomach. Gaseous small bowel distention in the upper abdomen is partially included in the field of view. Minor bibasilar atelectasis. IMPRESSION: Tip and side port of the enteric tube below the diaphragm in the stomach. Electronically Signed   By: Chadwick Colonel M.D.   On: 02/29/2024 13:41   CT ABDOMEN PELVIS W CONTRAST Result Date: 02/29/2024 CLINICAL DATA:  Acute abdominal pain starting on Saturday with vomiting last night and this morning. EXAM: CT ABDOMEN AND PELVIS  WITH CONTRAST TECHNIQUE: Multidetector CT imaging of the abdomen and pelvis was performed using the standard protocol following bolus administration of intravenous contrast. RADIATION DOSE REDUCTION: This exam was performed according to the departmental dose-optimization program which includes automated exposure control, adjustment of the mA and/or kV according to patient size and/or use of iterative reconstruction technique. CONTRAST:  OMNIPAQUE  IOHEXOL  300 MG/ML  SOLN COMPARISON:  05/31/2022 FINDINGS: Lower chest: Mild cardiomegaly. Subtle fullness of the distal esophagus at the gastroesophageal junction is probably  incidental on image 17 of series 2, and less likely to be due to a small mass. Dependent atelectasis in both lower lobes. Hepatobiliary: Mild focal steatosis in segment 4 adjacent to the falciform ligament. Multiple gallstones are present measuring up to 2.1 cm in long axis. Pancreas: Unremarkable Spleen: Unremarkable Adrenals/Urinary Tract: Simple right renal cysts noted. A left kidney upper pole exophytic homogeneous lesion measuring 1.6 cm in diameter has an internal density of 36 Hounsfield units which is indeterminate for mass versus cyst. This previously measured 0.8 cm in diameter on 05/31/2022. A 2.7 cm in long axis left kidney lower pole lesion on image 39 of series 2 measures 25 Hounsfield units on portal venous phase images and is most likely a complex cyst. Adrenal glands unremarkable. Stomach/Bowel: Small bowel dilatation up to 4.2 cm extending down to a angulated loop transition point in the right lower quadrant shown on image 51 series 8 where there is an abrupt transition between dilated and nondilated bowel. Appearance favors small bowel obstruction due to adhesion. Trace amount of free fluid adjacent to the bowel loops and in the right paracolic gutter. No extraluminal gas or portal venous gas observed. No pneumatosis or definitely necrotic bowel currently. The contents of the small bowel approaching the site of obstruction have a stool like appearance. Sigmoid colon diverticulosis. Vascular/Lymphatic: Minimal iliac artery atheromatous vascular calcification. Reproductive: Substantial prostatomegaly with heterogeneous enhancement in the prostate gland, including accentuated but nonspecific enhancement in the left posterior peripheral zone which could be due to prior inflammation or neoplasm. Other: No supplemental non-categorized findings. Musculoskeletal: 3 cm in long axis sclerotic lesion of the left iliac crest is nonspecific but sclerotic metastatic disease is not excluded as a possibility. 7  mm sclerotic lesion in the left iliac bone on image 73 series 2 is more likely to be a bone island based on morphology. Similar probable bone island in the left femoral head. IMPRESSION: 1. Small bowel obstruction with transition point in the right lower quadrant, probably due to adhesion given the angulated loop at the transition. Trace amount of free fluid adjacent to the bowel loops and in the right paracolic gutter. No extraluminal gas or portal venous gas observed. Surgical consultation recommended. 2. Substantial prostatomegaly with heterogeneous enhancement in the prostate gland, including accentuated but nonspecific enhancement in the left posterior peripheral zone which could be due to prior inflammation or neoplasm. Correlate with PSA level and consider urology referral. 3. 3 cm in long axis sclerotic lesion of the left iliac crest is nonspecific but sclerotic metastatic disease is not excluded as a possibility. 4. Subtle fullness of the distal esophagus at the gastroesophageal junction is probably incidental and less likely to be due to a small mass. 5. Left kidney upper pole exophytic homogeneous lesion measuring 1.6 cm in diameter has an internal density of 36 Hounsfield units which is indeterminate for mass versus cyst. This previously measured 0.8 cm in diameter on 05/31/2022. Elective renal protocol CT or MRI with and without  contrast is recommended in the nonemergent setting for further characterization, to exclude a renal cell carcinoma. 6. Sigmoid colon diverticulosis. 7. Mild cardiomegaly. 8. Minimal iliac artery atheromatous vascular calcification. Electronically Signed   By: Freida Jes M.D.   On: 02/29/2024 10:49    Anti-infectives: Anti-infectives (From admission, onward)    None        Assessment/Plan  SBO - Hx of recent robotic right inguinal hernia repair at [facility] 07/29/23 - CT 5/19 with SBO with transition in the RLQ, trace amount free fluid adjacent to this,  substantial prostatomegaly, sclerotic lesion of left iliac crest can't exclude metastatic lesion, subtle fullness of distal esophagus and GE junction, L renal lesion indeterminate for mass vs cyst - NGT placed at [facility] - SBO protocl >> contrast in colon, persistently dilated loops of small bowel. Having BMs. NG clamp trial - allow CLD. D/C NG the afternoon if tolerates - workup of prostatomegaly and left iliac and renal lesions per primary attending  - no indication for emergent surgical intervention at this time but we will follow    FEN: sips of clears, NGT clamped, IVF per [facility] VTE: ok to have SQH or LMWH from surgical standpoint ID: no current abx   - per [facility] -  HTN HLD Prostatomegaly - hx of TURP in 2012 by Dr. Ranae Burrow, PSA elevated, unsure of significance of that    LOS: 1 day   I reviewed nursing notes, hospitalist notes, last 24 h vitals and pain scores, last 48 h intake and output, last 24 h labs and trends, and last 24 h imaging results.  This care required moderate level of medical decision making.   Michial Akin, PA-C [facility] Please see Amion for pager number during day hours 7:00am-4:30pm

## 2024-03-02 DIAGNOSIS — N400 Enlarged prostate without lower urinary tract symptoms: Secondary | ICD-10-CM

## 2024-03-02 DIAGNOSIS — N2889 Other specified disorders of kidney and ureter: Secondary | ICD-10-CM | POA: Diagnosis not present

## 2024-03-02 DIAGNOSIS — K56609 Unspecified intestinal obstruction, unspecified as to partial versus complete obstruction: Secondary | ICD-10-CM | POA: Diagnosis not present

## 2024-03-02 DIAGNOSIS — R972 Elevated prostate specific antigen [PSA]: Secondary | ICD-10-CM | POA: Diagnosis not present

## 2024-03-02 LAB — CBC
HCT: 38.1 % — ABNORMAL LOW (ref 39.0–52.0)
Hemoglobin: 11.4 g/dL — ABNORMAL LOW (ref 13.0–17.0)
MCH: 24.9 pg — ABNORMAL LOW (ref 26.0–34.0)
MCHC: 29.9 g/dL — ABNORMAL LOW (ref 30.0–36.0)
MCV: 83.2 fL (ref 80.0–100.0)
Platelets: 207 10*3/uL (ref 150–400)
RBC: 4.58 MIL/uL (ref 4.22–5.81)
RDW: 15.2 % (ref 11.5–15.5)
WBC: 6.7 10*3/uL (ref 4.0–10.5)
nRBC: 0 % (ref 0.0–0.2)

## 2024-03-02 LAB — BASIC METABOLIC PANEL WITH GFR
Anion gap: 6 (ref 5–15)
BUN: 16 mg/dL (ref 8–23)
CO2: 23 mmol/L (ref 22–32)
Calcium: 8.2 mg/dL — ABNORMAL LOW (ref 8.9–10.3)
Chloride: 105 mmol/L (ref 98–111)
Creatinine, Ser: 0.5 mg/dL — ABNORMAL LOW (ref 0.61–1.24)
GFR, Estimated: >60 mL/min (ref >60)
Glucose, Bld: 91 mg/dL (ref 70–99)
Potassium: 3.7 mmol/L (ref 3.5–5.1)
Sodium: 134 mmol/L — ABNORMAL LOW (ref 135–145)

## 2024-03-02 MED ORDER — ATORVASTATIN CALCIUM 20 MG PO TABS
20.0000 mg | ORAL_TABLET | Freq: Every day | ORAL | Status: DC
  Filled 2024-03-02: qty 1

## 2024-03-02 NOTE — Progress Notes (Signed)
 Patient discharged to home, AVS and discharge instructions reviewed with the patient and his son. All questions answered. Patient's son will call and schedule f/u appointments with urology and PCP. No new prescriptions. IV removed. Patient assisted to exit by staff member, family provided transportation.

## 2024-03-02 NOTE — Progress Notes (Signed)
 Progress Note     Subjective: Pt reports mild abdominal soreness but overall improved. Having bowel function. Denies nausea or vomiting. Son at bedside this AM.   Objective: Vital signs in last 24 hours: Temp:  [97.7 F (36.5 C)-98.5 F (36.9 C)] 97.9 F (36.6 C) (05/21 0559) Pulse Rate:  [58-67] 58 (05/21 0559) Resp:  [16-18] 18 (05/21 0559) BP: (109-113)/(73-76) 109/73 (05/21 0559) SpO2:  [95 %-97 %] 97 % (05/21 0559) Last BM Date : 03/02/24  Intake/Output from previous day: 05/20 0701 - 05/21 0700 In: 940 [P.O.:840; NG/GT:100] Out: 1 [Emesis/NG output:1] Intake/Output this shift: Total I/O In: 240 [P.O.:240] Out: -   PE: General: pleasant, WD, WN male who is laying in bed in NAD Heart: regular, rate, and rhythm.   Lungs: Respiratory effort nonlabored Abd: soft, NT, ND Psych: A&Ox3 with an appropriate affect.    Lab Results:  Recent Labs    03/01/24 0458 03/02/24 0515  WBC 8.1 6.7  HGB 11.5* 11.4*  HCT 37.9* 38.1*  PLT 232 207   BMET Recent Labs    03/01/24 0458 03/02/24 0515  NA 140 134*  K 3.9 3.7  CL 108 105  CO2 25 23  GLUCOSE 106* 91  BUN 18 16  CREATININE 0.85 0.50*  CALCIUM 8.0* 8.2*   PT/INR No results for input(s): "LABPROT", "INR" in the last 72 hours. CMP     Component Value Date/Time   NA 134 (L) 03/02/2024 0515   K 3.7 03/02/2024 0515   CL 105 03/02/2024 0515   CO2 23 03/02/2024 0515   GLUCOSE 91 03/02/2024 0515   BUN 16 03/02/2024 0515   CREATININE 0.50 (L) 03/02/2024 0515   CALCIUM 8.2 (L) 03/02/2024 0515   PROT 5.9 (L) 03/01/2024 0458   ALBUMIN 3.1 (L) 03/01/2024 0458   AST 13 (L) 03/01/2024 0458   ALT 12 03/01/2024 0458   ALKPHOS 55 03/01/2024 0458   BILITOT 1.9 (H) 03/01/2024 0458   GFRNONAA >60 03/02/2024 0515   GFRAA 84 (L) 11/13/2014 0925   Lipase     Component Value Date/Time   LIPASE 48 02/29/2024 0739       Studies/Results: DG Abd Portable 1V Result Date: 03/01/2024 CLINICAL DATA:  [id number] SBO  (small bowel obstruction) (HCC) [id number] EXAM: PORTABLE ABDOMEN - 1 VIEW COMPARISON:  Feb 29, 2024 FINDINGS: Persistent dilation of a couple of short segments of small bowel in the left abdomen measuring 4 cm. Enteric contrast within the colon. Esophagogastric tube terminates in the decompressed stomach. No pneumoperitoneum. No organomegaly or radiopaque calculi. No acute fracture or destructive lesion. IMPRESSION: Persistent dilation of a couple of short segments of small bowel in the left abdomen. At this time, enteric contrast is present within the colon, which is mostly decompressed. Electronically Signed   By: Rance Burrows M.D.   On: 03/01/2024 13:28   DG Abd Portable 1V-Small Bowel Obstruction Protocol-initial, 8 hr delay Result Date: 02/29/2024 CLINICAL DATA:  Small bowel cortical, 8 hours post contrast. EXAM: PORTABLE ABDOMEN - 1 VIEW COMPARISON:  CT abdomen and pelvis 02/29/2024. FINDINGS: Diffusely dilated small bowel loops are again seen measuring up to 5.2 cm. Contrast is seen throughout dilated small bowel, but is not seen within nondilated small bowel or within the colon. Contrast is seen in the bladder. A nasogastric tube tip is in the body of the stomach. IMPRESSION: Findings compatible with small-bowel obstruction. Electronically Signed   By: Tyron Gallon M.D.   On: 02/29/2024 23:39   DG  Abdomen 1 View Result Date: 02/29/2024 CLINICAL DATA:  NG tube placement EXAM: ABDOMEN - 1 VIEW COMPARISON:  CT earlier today FINDINGS: Tip and side port of the enteric tube below the diaphragm in the stomach. Gaseous small bowel distention in the upper abdomen is partially included in the field of view. Minor bibasilar atelectasis. IMPRESSION: Tip and side port of the enteric tube below the diaphragm in the stomach. Electronically Signed   By: Chadwick Colonel M.D.   On: 02/29/2024 13:41    Anti-infectives: Anti-infectives (From admission, onward)    None        Assessment/Plan  SBO - Hx of  recent robotic right inguinal hernia repair at [facility] 07/29/23 - CT 5/19 with SBO with transition in the RLQ, trace amount free fluid adjacent to this, substantial prostatomegaly, sclerotic lesion of left iliac crest can't exclude metastatic lesion, subtle fullness of distal esophagus and GE junction, L renal lesion indeterminate for mass vs cyst - SBO protocol >> contrast in colon - tolerated FLD and having bowel function, advanced to soft diet already this AM - abdominal exam benign this AM - workup of prostatomegaly and left iliac and renal lesions per primary attending  - no indication for emergent surgical intervention, if tolerating diet and still having bowel function stable for DC later today from surgical perspective   FEN: SOFT diet, IVF per [facility] VTE: ok to have SQH or LMWH from surgical standpoint ID: no current abx   - per [facility] -  HTN HLD Prostatomegaly - hx of TURP in 2012 by Dr. Inga Manges, PSA elevated, unsure of significance of that ; F/U Dr. Inga Manges as an outpatient     LOS: 2 days   I reviewed hospitalist notes, last 24 h vitals and pain scores, last 48 h intake and output, last 24 h labs and trends, and last 24 h imaging results.  This care required moderate level of medical decision making.    Annetta Killian, [facility] 03/02/2024, 10:04 AM Please see Amion for pager number during day hours 7:00am-4:30pm

## 2024-03-02 NOTE — Progress Notes (Signed)
 Mobility Specialist - Progress Note   03/02/24 0923  Mobility  Activity Ambulated independently in hallway  Level of Assistance Independent  Assistive Device None  Distance Ambulated (ft) 1000 ft  Activity Response Tolerated well  Mobility Referral Yes  Mobility visit 1 Mobility  Mobility Specialist Start Time (ACUTE ONLY) 0910  Mobility Specialist Stop Time (ACUTE ONLY) [id number]  Mobility Specialist Time Calculation (min) (ACUTE ONLY) 12 min   Pt received in bed and agreeable to mobility. No complaints during session. Pt to EOB after session with all needs met.    [facility]

## 2024-03-02 NOTE — Discharge Summary (Signed)
 Physician Discharge Summary   Patient: Johnny Heath MRN: [id number] DOB: 1946-03-30  Admit date:     02/29/2024  Discharge date: 03/02/24  Discharge Physician: Jerline Moon   PCP: [facility]   Recommendations at discharge:    Elevated PSA Left iliac crest nonspecific sclerotic lesion Known enlarged prostate.  On CT abdomen and pelvis here, noted finding of heterogenous enhancement of the prostate.  UA normal, no pelvic pain, doubt prostatitis.  PSA elevated Follow-up with Dr. Inga Manges urology recommended   Left kidney exophytic mass Incidental finding of 1.6 cm exophytic left kidney mass.  This is enlarged since 05/31/2022. Recommend outpatient renal protocol CT or MRI    Subtle fullness distal esophagus and GE junction probably incidental and not mass Could consider repeat imaging as an outpatient or GI referral  Discharge Diagnoses: Principal Problem:   SBO (small bowel obstruction) (HCC) Active Problems:   Hypertension   HLD (hyperlipidemia)   Elevated PSA   Left kidney exophytic mass  Resolved Problems:   * No resolved hospital problems. *  Hospital Course: 78 year old man presenting with abdominal pain, vomiting.  Admitted for small bowel obstruction.  Seen by general surgery, it managed conservatively with resolution.  Tolerating diet.  Discharged home in good condition.  Consultants General surgery  Procedures/Events None  * SBO (small bowel obstruction) (HCC) NG placed, treated with small bowel protocol.   Clinically resolved, tolerating diet.  Cleared for discharge by general surgery.     Elevated PSA Left iliac crest nonspecific sclerotic lesion Known enlarged prostate.  On CT abdomen and pelvis here, noted finding of heterogenous enhancement of the prostate.  UA normal, no pelvic pain, doubt prostatitis.  PSA elevated Follow-up with Dr. Inga Manges urology recommended Will forward DC summary to Dr. Inga Manges   Left kidney exophytic mass Incidental  finding of 1.6 cm exophytic left kidney mass.  This is enlarged since 05/31/2022. Recommend outpatient renal protocol CT or MRI    Subtle fullness distal esophagus and GE junction probably incidental and not mass Could consider repeat imaging as an outpatient or GI referral  HLD (hyperlipidemia) Resume Lipitor  Disposition: Home Diet recommendation:  Regular diet DISCHARGE MEDICATION: Allergies as of 03/02/2024       Reactions   Penicillins Itching        Medication List     TAKE these medications    amLODipine 5 MG tablet Commonly known as: NORVASC Take 5 mg by mouth daily.   atorvastatin 20 MG tablet Commonly known as: LIPITOR Take 20 mg by mouth daily.        Follow-up Information     Homero Luster, MD. Schedule an appointment as soon as possible for a visit in 2 week(s).   Specialty: Urology Contact information: [address] [place] [address] [phone]         [facility]. Schedule an appointment as soon as possible for a visit in 2 week(s).   Contact information: [address] [place] [address] [phone]                Feels fine Tolerating diet  Discharge Exam: [name] Weights   02/29/24 0713  Weight: 75.8 kg   Physical Exam Vitals reviewed.  Constitutional:      General: He is not in acute distress.    Appearance: He is not ill-appearing or toxic-appearing.  Cardiovascular:     Rate and Rhythm: Normal rate and regular rhythm.     Heart sounds: No  murmur heard. Pulmonary:     Effort: Pulmonary effort is normal. No respiratory distress.     Breath sounds: No wheezing, rhonchi or rales.  Abdominal:     Palpations: Abdomen is soft.  Neurological:     Mental Status: He is alert.  Psychiatric:        Mood and Affect: Mood normal.        Behavior: Behavior normal.      Condition at discharge: good  The results of significant diagnostics from this hospitalization (including imaging, microbiology,  ancillary and laboratory) are listed below for reference.   Imaging Studies: DG Abd Portable 1V Result Date: 03/01/2024 CLINICAL DATA:  [id number] SBO (small bowel obstruction) (HCC) [id number] EXAM: PORTABLE ABDOMEN - 1 VIEW COMPARISON:  Feb 29, 2024 FINDINGS: Persistent dilation of a couple of short segments of small bowel in the left abdomen measuring 4 cm. Enteric contrast within the colon. Esophagogastric tube terminates in the decompressed stomach. No pneumoperitoneum. No organomegaly or radiopaque calculi. No acute fracture or destructive lesion. IMPRESSION: Persistent dilation of a couple of short segments of small bowel in the left abdomen. At this time, enteric contrast is present within the colon, which is mostly decompressed. Electronically Signed   By: Rance Burrows M.D.   On: 03/01/2024 13:28   DG Abd Portable 1V-Small Bowel Obstruction Protocol-initial, 8 hr delay Result Date: 02/29/2024 CLINICAL DATA:  Small bowel cortical, 8 hours post contrast. EXAM: PORTABLE ABDOMEN - 1 VIEW COMPARISON:  CT abdomen and pelvis 02/29/2024. FINDINGS: Diffusely dilated small bowel loops are again seen measuring up to 5.2 cm. Contrast is seen throughout dilated small bowel, but is not seen within nondilated small bowel or within the colon. Contrast is seen in the bladder. A nasogastric tube tip is in the body of the stomach. IMPRESSION: Findings compatible with small-bowel obstruction. Electronically Signed   By: Tyron Gallon M.D.   On: 02/29/2024 23:39   DG Abdomen 1 View Result Date: 02/29/2024 CLINICAL DATA:  NG tube placement EXAM: ABDOMEN - 1 VIEW COMPARISON:  CT earlier today FINDINGS: Tip and side port of the enteric tube below the diaphragm in the stomach. Gaseous small bowel distention in the upper abdomen is partially included in the field of view. Minor bibasilar atelectasis. IMPRESSION: Tip and side port of the enteric tube below the diaphragm in the stomach. Electronically Signed   By: Chadwick Colonel M.D.   On: 02/29/2024 13:41   CT ABDOMEN PELVIS W CONTRAST Result Date: 02/29/2024 CLINICAL DATA:  Acute abdominal pain starting on Saturday with vomiting last night and this morning. EXAM: CT ABDOMEN AND PELVIS WITH CONTRAST TECHNIQUE: Multidetector CT imaging of the abdomen and pelvis was performed using the standard protocol following bolus administration of intravenous contrast. RADIATION DOSE REDUCTION: This exam was performed according to the departmental dose-optimization program which includes automated exposure control, adjustment of the mA and/or kV according to patient size and/or use of iterative reconstruction technique. CONTRAST:  OMNIPAQUE  IOHEXOL  300 MG/ML  SOLN COMPARISON:  05/31/2022 FINDINGS: Lower chest: Mild cardiomegaly. Subtle fullness of the distal esophagus at the gastroesophageal junction is probably incidental on image 17 of series 2, and less likely to be due to a small mass. Dependent atelectasis in both lower lobes. Hepatobiliary: Mild focal steatosis in segment 4 adjacent to the falciform ligament. Multiple gallstones are present measuring up to 2.1 cm in long axis. Pancreas: Unremarkable Spleen: Unremarkable Adrenals/Urinary Tract: Simple right renal cysts noted. A left kidney upper pole exophytic homogeneous  lesion measuring 1.6 cm in diameter has an internal density of 36 Hounsfield units which is indeterminate for mass versus cyst. This previously measured 0.8 cm in diameter on 05/31/2022. A 2.7 cm in long axis left kidney lower pole lesion on image 39 of series 2 measures 25 Hounsfield units on portal venous phase images and is most likely a complex cyst. Adrenal glands unremarkable. Stomach/Bowel: Small bowel dilatation up to 4.2 cm extending down to a angulated loop transition point in the right lower quadrant shown on image 51 series 8 where there is an abrupt transition between dilated and nondilated bowel. Appearance favors small bowel obstruction due to  adhesion. Trace amount of free fluid adjacent to the bowel loops and in the right paracolic gutter. No extraluminal gas or portal venous gas observed. No pneumatosis or definitely necrotic bowel currently. The contents of the small bowel approaching the site of obstruction have a stool like appearance. Sigmoid colon diverticulosis. Vascular/Lymphatic: Minimal iliac artery atheromatous vascular calcification. Reproductive: Substantial prostatomegaly with heterogeneous enhancement in the prostate gland, including accentuated but nonspecific enhancement in the left posterior peripheral zone which could be due to prior inflammation or neoplasm. Other: No supplemental non-categorized findings. Musculoskeletal: 3 cm in long axis sclerotic lesion of the left iliac crest is nonspecific but sclerotic metastatic disease is not excluded as a possibility. 7 mm sclerotic lesion in the left iliac bone on image 73 series 2 is more likely to be a bone island based on morphology. Similar probable bone island in the left femoral head. IMPRESSION: 1. Small bowel obstruction with transition point in the right lower quadrant, probably due to adhesion given the angulated loop at the transition. Trace amount of free fluid adjacent to the bowel loops and in the right paracolic gutter. No extraluminal gas or portal venous gas observed. Surgical consultation recommended. 2. Substantial prostatomegaly with heterogeneous enhancement in the prostate gland, including accentuated but nonspecific enhancement in the left posterior peripheral zone which could be due to prior inflammation or neoplasm. Correlate with PSA level and consider urology referral. 3. 3 cm in long axis sclerotic lesion of the left iliac crest is nonspecific but sclerotic metastatic disease is not excluded as a possibility. 4. Subtle fullness of the distal esophagus at the gastroesophageal junction is probably incidental and less likely to be due to a small mass. 5. Left  kidney upper pole exophytic homogeneous lesion measuring 1.6 cm in diameter has an internal density of 36 Hounsfield units which is indeterminate for mass versus cyst. This previously measured 0.8 cm in diameter on 05/31/2022. Elective renal protocol CT or MRI with and without contrast is recommended in the nonemergent setting for further characterization, to exclude a renal cell carcinoma. 6. Sigmoid colon diverticulosis. 7. Mild cardiomegaly. 8. Minimal iliac artery atheromatous vascular calcification. Electronically Signed   By: Freida Jes M.D.   On: 02/29/2024 10:49    Microbiology: No results found for this or any previous visit.  Labs: CBC: Recent Labs  Lab 02/29/24 0739 03/01/24 0458 03/02/24 0515  WBC 9.0 8.1 6.7  NEUTROABS 7.6  --   --   HGB 12.8* 11.5* 11.4*  HCT 39.7 37.9* 38.1*  MCV 77.4* 82.4 83.2  PLT 233 232 207   Basic Metabolic Panel: Recent Labs  Lab 02/29/24 0739 03/01/24 0458 03/02/24 0515  NA 137 140 134*  K 3.9 3.9 3.7  CL 101 108 105  CO2 25 25 23   GLUCOSE 118* 106* 91  BUN 15 18 16   CREATININE  0.98 0.85 0.50*  CALCIUM 9.1 8.0* 8.2*  MG  --  2.5*  --    Liver Function Tests: Recent Labs  Lab 02/29/24 0739 03/01/24 0458  AST 19 13*  ALT 14 12  ALKPHOS 79 55  BILITOT 1.8* 1.9*  PROT 7.1 5.9*  ALBUMIN 4.2 3.1*   CBG: No results for input(s): "GLUCAP" in the last 168 hours.  Discharge time spent: greater than 30 minutes.  Signed: Jerline Moon, MD [facility] 03/02/2024

## 2024-05-02 ENCOUNTER — Other Ambulatory Visit: Payer: Self-pay | Admitting: Urology

## 2024-05-02 DIAGNOSIS — R972 Elevated prostate specific antigen [PSA]: Secondary | ICD-10-CM

## 2024-06-06 ENCOUNTER — Other Ambulatory Visit

## 2024-07-04 ENCOUNTER — Other Ambulatory Visit
